# Patient Record
Sex: Male | Born: 1990 | Race: White | Hispanic: No | Marital: Single | Smoking: Current every day smoker
Health system: Southern US, Community
[De-identification: ages and names within clinical notes are randomized; demographics above are authoritative.]

## PROBLEM LIST (undated history)

## (undated) DIAGNOSIS — F329 Major depressive disorder, single episode, unspecified: Secondary | ICD-10-CM

## (undated) DIAGNOSIS — F32A Depression, unspecified: Secondary | ICD-10-CM

## (undated) DIAGNOSIS — K219 Gastro-esophageal reflux disease without esophagitis: Secondary | ICD-10-CM

## (undated) HISTORY — PX: WISDOM TOOTH EXTRACTION: SHX21

---

## 2012-12-08 ENCOUNTER — Encounter (HOSPITAL_COMMUNITY): Payer: Self-pay | Admitting: Emergency Medicine

## 2012-12-08 ENCOUNTER — Emergency Department (HOSPITAL_COMMUNITY)
Admission: EM | Admit: 2012-12-08 | Discharge: 2012-12-08 | Disposition: A | Discharge status: Home / Self Care | Payer: Self-pay | Attending: Emergency Medicine | Admitting: Emergency Medicine

## 2012-12-08 ENCOUNTER — Emergency Department (HOSPITAL_COMMUNITY): Payer: Self-pay

## 2012-12-08 DIAGNOSIS — Z8659 Personal history of other mental and behavioral disorders: Secondary | ICD-10-CM | POA: Insufficient documentation

## 2012-12-08 DIAGNOSIS — R509 Fever, unspecified: Secondary | ICD-10-CM | POA: Insufficient documentation

## 2012-12-08 DIAGNOSIS — K219 Gastro-esophageal reflux disease without esophagitis: Secondary | ICD-10-CM | POA: Insufficient documentation

## 2012-12-08 DIAGNOSIS — Z79899 Other long term (current) drug therapy: Secondary | ICD-10-CM | POA: Insufficient documentation

## 2012-12-08 HISTORY — DX: Gastro-esophageal reflux disease without esophagitis: K21.9

## 2012-12-08 HISTORY — DX: Depression, unspecified: F32.A

## 2012-12-08 HISTORY — DX: Major depressive disorder, single episode, unspecified: F32.9

## 2012-12-08 LAB — BASIC METABOLIC PANEL
BUN: 12 mg/dL (ref 6–23)
CO2: 30 mEq/L (ref 19–32)
Calcium: 9.7 mg/dL (ref 8.4–10.5)
Creatinine, Ser: 0.89 mg/dL (ref 0.50–1.35)
GFR calc Af Amer: >90 mL/min (ref >90)
Glucose, Bld: 77 mg/dL (ref 70–99)

## 2012-12-08 LAB — POCT I-STAT TROPONIN I: Troponin i, poc: 0 ng/mL (ref 0.00–0.08)

## 2012-12-08 LAB — CBC
HCT: 45.3 % (ref 39.0–52.0)
Hemoglobin: 15.7 g/dL (ref 13.0–17.0)
MCH: 29.7 pg (ref 26.0–34.0)
MCV: 85.6 fL (ref 78.0–100.0)
RBC: 5.29 MIL/uL (ref 4.22–5.81)

## 2012-12-08 MED ORDER — OMEPRAZOLE 20 MG PO CPDR
20.0000 mg | DELAYED_RELEASE_CAPSULE | Freq: Every day | ORAL | Status: DC
Start: 2012-12-08 — End: 2015-03-25

## 2012-12-08 MED ORDER — PANTOPRAZOLE SODIUM 40 MG PO TBEC
40.0000 mg | DELAYED_RELEASE_TABLET | Freq: Every day | ORAL | Status: DC
  Administered 2012-12-08: 40 mg via ORAL
  Filled 2012-12-08: qty 1

## 2012-12-08 MED ORDER — GI COCKTAIL ~~LOC~~
30.0000 mL | Freq: Once | ORAL | Status: AC
  Administered 2012-12-08: 30 mL via ORAL
  Filled 2012-12-08: qty 30
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
## 2012-12-08 NOTE — ED Provider Notes (Signed)
CSN: [id number]     Arrival date & time 12/08/12  1901 History   First MD Initiated Contact with Patient 12/08/12 2047     Chief Complaint  Patient presents with  . Chest Pain   (Consider location/radiation/quality/duration/timing/severity/associated sxs/prior Treatment) HPI  This is a 22 year old male who presents with chest pain. Patient reports intermittent chest pain her last several years. He does have a history of GERD and was on Prilosec but has stopped taking that. He associates his chest pain with eating and stress. He reports that it is sharp pain and nonradiating. He had increased pain this evening. He denies any nausea or vomiting. Usually the pain is worse at night. Patient currently reports his pain is 3/10. He denies any pleuritic component to the pain. He denies any shortness of breath, blood clots, leg swelling, recent hospitalization.  Past Medical History  Diagnosis Date  . GERD (gastroesophageal reflux disease)   . Depression    History reviewed. No pertinent past surgical history. History reviewed. No pertinent family history. History  Substance Use Topics  . Smoking status: Never Smoker   . Smokeless tobacco: Never Used  . Alcohol Use: Yes     Comment: occ.    Review of Systems  Constitutional: Positive for fever.  Respiratory: Negative.  Negative for shortness of breath.   Cardiovascular: Positive for chest pain.  Gastrointestinal: Negative.  Negative for nausea, vomiting and abdominal pain.  Genitourinary: Negative.  Negative for dysuria.  Musculoskeletal: Negative for back pain.  Neurological: Negative for headaches.  All other systems reviewed and are negative.    Allergies  Review of patient's allergies indicates no known allergies.  Home Medications   Current Outpatient Rx  Name  Route  Sig  Dispense  Refill  . ibuprofen (ADVIL,MOTRIN) 200 MG tablet   Oral   Take 400 mg by mouth every 6 (six) hours as needed (pain).         Marland Kitchen omeprazole  (PRILOSEC) 20 MG capsule   Oral   Take 1 capsule (20 mg total) by mouth daily.   30 capsule   0    BP 118/63  Pulse 62  Temp(Src) 97.8 F (36.6 C) (Oral)  Resp 16  SpO2 97% Physical Exam  Nursing note and vitals reviewed. Constitutional: He is oriented to person, place, and time. He appears well-developed and well-nourished. No distress.  HENT:  Head: Normocephalic and atraumatic.  Cardiovascular: Normal rate, regular rhythm and normal heart sounds.   No murmur heard. Pulmonary/Chest: Effort normal and breath sounds normal. No respiratory distress.  Abdominal: Soft. Bowel sounds are normal. There is no tenderness.  Musculoskeletal: He exhibits no edema.  Lymphadenopathy:    He has no cervical adenopathy.  Neurological: He is alert and oriented to person, place, and time.  Skin: Skin is warm and dry.  Psychiatric: He has a normal mood and affect.    ED Course  Procedures (including critical care time) Labs Review Labs Reviewed  CBC  BASIC METABOLIC PANEL  PRO B NATRIURETIC PEPTIDE  POCT I-STAT TROPONIN I   Imaging Review Dg Chest 2 View  12/08/2012   CLINICAL DATA:  Chest pain for 2 years.  Ex-smoker.  EXAM: CHEST  2 VIEW  COMPARISON:  None.  FINDINGS: A minimal pectus excavatum deformity. Midline trachea. Normal heart size and mediastinal contours. No pleural effusion or pneumothorax. Clear lungs.  IMPRESSION: Normal chest.   Electronically Signed   By: Jeronimo Greaves M.D.   On: 12/08/2012 19:47  EKG Interpretation    Date/Time:  Monday December 08 2012 19:06:38 EST Ventricular Rate:  79 PR Interval:  160 QRS Duration: 96 QT Interval:  356 QTC Calculation: 408 R Axis:   99 Text Interpretation:  Sinus rhythm Borderline right axis deviation Nonspecific T abnrm, anterolateral leads ST elev, probable normal early repol pattern No old tracing to compare Confirmed by [facility]  MD, TREY ([id number]) on 12/08/2012 7:12:33 PM            MDM   1. GERD (gastroesophageal  reflux disease)    Symptoms most consistent with GERD. Screening chest x-ray is negative for pneumonia or pneumothorax and troponin is negative. EKG is reassuring. Patient was given GI cocktail and PPI with improvement of symptoms. He was encouraged to restart a PPI as an outpatient.  After history, exam, and medical workup I feel the patient has been appropriately medically screened and is safe for discharge home. Pertinent diagnoses were discussed with the patient. Patient was given return precautions.     Shon Baton, MD 12/09/12 [phone]

## 2012-12-08 NOTE — ED Notes (Signed)
Pt reports he was dx with GERD but was never able to fill his Rx.

## 2013-04-25 ENCOUNTER — Encounter (HOSPITAL_COMMUNITY): Payer: Self-pay | Admitting: Emergency Medicine

## 2013-04-25 ENCOUNTER — Emergency Department (HOSPITAL_COMMUNITY)
Admission: EM | Admit: 2013-04-25 | Discharge: 2013-04-25 | Disposition: A | Discharge status: Home / Self Care | Payer: Self-pay | Attending: Emergency Medicine | Admitting: Emergency Medicine

## 2013-04-25 DIAGNOSIS — T300 Burn of unspecified body region, unspecified degree: Secondary | ICD-10-CM

## 2013-04-25 DIAGNOSIS — K219 Gastro-esophageal reflux disease without esophagitis: Secondary | ICD-10-CM | POA: Insufficient documentation

## 2013-04-25 DIAGNOSIS — X131XXA Other contact with steam and other hot vapors, initial encounter: Secondary | ICD-10-CM

## 2013-04-25 DIAGNOSIS — X12XXXA Contact with other hot fluids, initial encounter: Secondary | ICD-10-CM | POA: Insufficient documentation

## 2013-04-25 DIAGNOSIS — T23229A Burn of second degree of unspecified single finger (nail) except thumb, initial encounter: Secondary | ICD-10-CM | POA: Insufficient documentation

## 2013-04-25 DIAGNOSIS — Z79899 Other long term (current) drug therapy: Secondary | ICD-10-CM | POA: Insufficient documentation

## 2013-04-25 DIAGNOSIS — Y93G3 Activity, cooking and baking: Secondary | ICD-10-CM | POA: Insufficient documentation

## 2013-04-25 DIAGNOSIS — T22239A Burn of second degree of unspecified upper arm, initial encounter: Secondary | ICD-10-CM | POA: Insufficient documentation

## 2013-04-25 DIAGNOSIS — Z8659 Personal history of other mental and behavioral disorders: Secondary | ICD-10-CM | POA: Insufficient documentation

## 2013-04-25 DIAGNOSIS — Y9289 Other specified places as the place of occurrence of the external cause: Secondary | ICD-10-CM | POA: Insufficient documentation

## 2013-04-25 MED ORDER — SILVER SULFADIAZINE 1 % EX CREA: 1.0000 "application " | TOPICAL_CREAM | Freq: Every day | CUTANEOUS | Status: DC

## 2013-04-25 MED ORDER — MORPHINE SULFATE 4 MG/ML IJ SOLN
4.0000 mg | Freq: Once | INTRAMUSCULAR | Status: AC
  Administered 2013-04-25: 4 mg via INTRAVENOUS
  Filled 2013-04-25: qty 1

## 2013-04-25 MED ORDER — SILVER SULFADIAZINE 1 % EX CREA
TOPICAL_CREAM | Freq: Once | CUTANEOUS | Status: AC
  Administered 2013-04-25: 1 via TOPICAL
  Filled 2013-04-25: qty 85

## 2013-04-25 MED ORDER — HYDROCODONE-ACETAMINOPHEN 5-325 MG PO TABS: 1.0000 | ORAL_TABLET | Freq: Four times a day (QID) | ORAL | Status: DC | PRN
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
## 2013-04-25 NOTE — Discharge Instructions (Signed)

## 2013-04-25 NOTE — ED Notes (Signed)
Pt states he is SOB. Lungs are clear bilaterally and O2 sats 99% on room air.
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
## 2014-03-23 ENCOUNTER — Encounter (HOSPITAL_COMMUNITY): Payer: Self-pay | Admitting: *Deleted

## 2014-03-23 ENCOUNTER — Emergency Department (HOSPITAL_COMMUNITY)
Admission: EM | Admit: 2014-03-23 | Discharge: 2014-03-23 | Disposition: A | Discharge status: Home / Self Care | Payer: Self-pay | Attending: Emergency Medicine | Admitting: Emergency Medicine

## 2014-03-23 DIAGNOSIS — Z79899 Other long term (current) drug therapy: Secondary | ICD-10-CM | POA: Insufficient documentation

## 2014-03-23 DIAGNOSIS — Z792 Long term (current) use of antibiotics: Secondary | ICD-10-CM | POA: Insufficient documentation

## 2014-03-23 DIAGNOSIS — K219 Gastro-esophageal reflux disease without esophagitis: Secondary | ICD-10-CM | POA: Insufficient documentation

## 2014-03-23 DIAGNOSIS — J069 Acute upper respiratory infection, unspecified: Secondary | ICD-10-CM | POA: Insufficient documentation

## 2014-03-23 DIAGNOSIS — Z8659 Personal history of other mental and behavioral disorders: Secondary | ICD-10-CM | POA: Insufficient documentation

## 2014-03-23 MED ORDER — IBUPROFEN 800 MG PO TABS: 800.0000 mg | ORAL_TABLET | Freq: Three times a day (TID) | ORAL | Status: DC | PRN

## 2014-03-23 MED ORDER — HYDROCODONE-ACETAMINOPHEN 7.5-325 MG/15ML PO SOLN: 10.0000 mL | Freq: Four times a day (QID) | ORAL | Status: DC | PRN

## 2014-03-23 NOTE — Discharge Instructions (Signed)
Read the information below.  Use the prescribed medication as directed.  Please discuss all new medications with your pharmacist.  You may return to the Emergency Department at any time for worsening condition or any new symptoms that concern you.   If you develop high fevers, difficulty swallowing or breathing, or you are unable to tolerate fluids by mouth, return to the ER immediately for a recheck.      Upper Respiratory Infection, Adult An upper respiratory infection (URI) is also sometimes known as the common cold. The upper respiratory tract includes the nose, sinuses, throat, trachea, and bronchi. Bronchi are the airways leading to the lungs. Most people improve within 1 week, but symptoms can last up to 2 weeks. A residual cough may last even longer.  CAUSES Many different viruses can infect the tissues lining the upper respiratory tract. The tissues become irritated and inflamed and often become very moist. Mucus production is also common. A cold is contagious. You can easily spread the virus to others by oral contact. This includes kissing, sharing a glass, coughing, or sneezing. Touching your mouth or nose and then touching a surface, which is then touched by another person, can also spread the virus. SYMPTOMS  Symptoms typically develop 1 to 3 days after you come in contact with a cold virus. Symptoms vary from person to person. They may include:  Runny nose.  Sneezing.  Nasal congestion.  Sinus irritation.  Sore throat.  Loss of voice (laryngitis).  Cough.  Fatigue.  Muscle aches.  Loss of appetite.  Headache.  Low-grade fever. DIAGNOSIS  You might diagnose your own cold based on familiar symptoms, since most people get a cold 2 to 3 times a year. Your caregiver can confirm this based on your exam. Most importantly, your caregiver can check that your symptoms are not due to another disease such as strep throat, sinusitis, pneumonia, asthma, or epiglottitis. Blood tests,  throat tests, and X-rays are not necessary to diagnose a common cold, but they may sometimes be helpful in excluding other more serious diseases. Your caregiver will decide if any further tests are required. RISKS AND COMPLICATIONS  You may be at risk for a more severe case of the common cold if you smoke cigarettes, have chronic heart disease (such as heart failure) or lung disease (such as asthma), or if you have a weakened immune system. The very young and very old are also at risk for more serious infections. Bacterial sinusitis, middle ear infections, and bacterial pneumonia can complicate the common cold. The common cold can worsen asthma and chronic obstructive pulmonary disease (COPD). Sometimes, these complications can require emergency medical care and may be life-threatening. PREVENTION  The best way to protect against getting a cold is to practice good hygiene. Avoid oral or hand contact with people with cold symptoms. Wash your hands often if contact occurs. There is no clear evidence that vitamin C, vitamin E, echinacea, or exercise reduces the chance of developing a cold. However, it is always recommended to get plenty of rest and practice good nutrition. TREATMENT  Treatment is directed at relieving symptoms. There is no cure. Antibiotics are not effective, because the infection is caused by a virus, not by bacteria. Treatment may include:  Increased fluid intake. Sports drinks offer valuable electrolytes, sugars, and fluids.  Breathing heated mist or steam (vaporizer or shower).  Eating chicken soup or other clear broths, and maintaining good nutrition.  Getting plenty of rest.  Using gargles or lozenges for  comfort.  Controlling fevers with ibuprofen or acetaminophen as directed by your caregiver.  Increasing usage of your inhaler if you have asthma. Zinc gel and zinc lozenges, taken in the first 24 hours of the common cold, can shorten the duration and lessen the severity of  symptoms. Pain medicines may help with fever, muscle aches, and throat pain. A variety of non-prescription medicines are available to treat congestion and runny nose. Your caregiver can make recommendations and may suggest nasal or lung inhalers for other symptoms.  HOME CARE INSTRUCTIONS   Only take over-the-counter or prescription medicines for pain, discomfort, or fever as directed by your caregiver.  Use a warm mist humidifier or inhale steam from a shower to increase air moisture. This may keep secretions moist and make it easier to breathe.  Drink enough water and fluids to keep your urine clear or pale yellow.  Rest as needed.  Return to work when your temperature has returned to normal or as your caregiver advises. You may need to stay home longer to avoid infecting others. You can also use a face mask and careful hand washing to prevent spread of the virus. SEEK MEDICAL CARE IF:   After the first few days, you feel you are getting worse rather than better.  You need your caregiver's advice about medicines to control symptoms.  You develop chills, worsening shortness of breath, or brown or red sputum. These may be signs of pneumonia.  You develop yellow or brown nasal discharge or pain in the face, especially when you bend forward. These may be signs of sinusitis.  You develop a fever, swollen neck glands, pain with swallowing, or white areas in the back of your throat. These may be signs of strep throat. SEEK IMMEDIATE MEDICAL CARE IF:   You have a fever.  You develop severe or persistent headache, ear pain, sinus pain, or chest pain.  You develop wheezing, a prolonged cough, cough up blood, or have a change in your usual mucus (if you have chronic lung disease).  You develop sore muscles or a stiff neck. Document Released: 06/20/2000 Document Revised: 03/19/2011 Document Reviewed: 04/01/2013 [facility] Patient Information 2015 [place], [place]. This information is not intended  to replace advice given to you by your health care provider. Make sure you discuss any questions you have with your health care provider.

## 2014-03-23 NOTE — ED Provider Notes (Signed)
CSN: [id number]     Arrival date & time 03/23/14  1323 History  This chart was scribed for Trixie Dredge, PA-C with Shon Baton, MD by Tonye Royalty, ED Scribe. This patient was seen in room TR05C/TR05C and the patient's care was started at 2:17 PM.    Chief Complaint  Patient presents with  . Sore Throat   HPI  HPI Comments: Terrence Shaw is a 24 y.o. male who presents to the Emergency Department complaining of sore throat with onset 2 days ago. He reports associated cough that produces green phlegm. He states then when he swallows, it feels like choking. He states he has not used any medication for it. He denies sick contacts.  He denies rhinorrhea congestion, ear pain, fever, chills, body aches, SOB, or chest pain.  Past Medical History  Diagnosis Date  . GERD (gastroesophageal reflux disease)   . Depression    History reviewed. No pertinent past surgical history. History reviewed. No pertinent family history. History  Substance Use Topics  . Smoking status: Never Smoker   . Smokeless tobacco: Never Used  . Alcohol Use: Yes     Comment: occ.    Review of Systems  Constitutional: Negative for fever and chills.  HENT: Positive for sore throat. Negative for congestion, ear pain and rhinorrhea.   Respiratory: Positive for cough. Negative for shortness of breath.   Cardiovascular: Negative for chest pain.  Musculoskeletal: Negative for myalgias.      Allergies  Review of patient's allergies indicates no known allergies.  Home Medications   Prior to Admission medications   Medication Sig Start Date End Date Taking? Authorizing Provider  albuterol (PROVENTIL HFA;VENTOLIN HFA) 108 (90 BASE) MCG/ACT inhaler Inhale 2 puffs into the lungs every 6 (six) hours as needed for wheezing or shortness of breath.    Historical Provider, MD  HYDROcodone-acetaminophen (NORCO/VICODIN) 5-325 MG per tablet Take 1 tablet by mouth every 6 (six) hours as needed. 04/25/13   Loma Boston, MD   ibuprofen (ADVIL,MOTRIN) 200 MG tablet Take 400 mg by mouth every 6 (six) hours as needed (pain).    Historical Provider, MD  omeprazole (PRILOSEC) 20 MG capsule Take 1 capsule (20 mg total) by mouth daily. 12/08/12   Shon Baton, MD  silver sulfADIAZINE (SILVADENE) 1 % cream Apply 1 application topically daily. 04/25/13   Loma Boston, MD   BP 134/74 mmHg  Pulse 104  Temp(Src) 98.5 F (36.9 C) (Oral)  Resp 16  SpO2 100% Physical Exam  Constitutional: He appears well-developed and well-nourished. No distress.  HENT:  Head: Normocephalic and atraumatic.  Mouth/Throat: Oropharynx is clear and moist. No oropharyngeal exudate.  Erythema and mild edema, no exudates  Eyes: Conjunctivae and EOM are normal. Right eye exhibits no discharge. Left eye exhibits no discharge.  Neck: Normal range of motion. Neck supple.  Mild tender anterior cervical lymphadenopathy  Cardiovascular: Normal rate and regular rhythm.   Pulmonary/Chest: Effort normal and breath sounds normal. No stridor. No respiratory distress. He has no wheezes. He has no rales.  Lymphadenopathy:    He has cervical adenopathy.  Neurological: He is alert.  Skin: He is not diaphoretic.  Nursing note and vitals reviewed.   ED Course  Procedures (including critical care time)  DIAGNOSTIC STUDIES: Oxygen Saturation is 100% on room air, normal by my interpretation.    COORDINATION OF CARE: 2:22 PM Discussed treatment plan with patient at beside, the patient agrees with the plan and has no further questions at this  time.   Labs Review Labs Reviewed - No data to display  Imaging Review No results found.   EKG Interpretation None      MDM   Final diagnoses:  URI (upper respiratory infection)    Afebrile, nontoxic patient with constellation of symptoms suggestive of viral syndrome.  No concerning findings on exam.  Discharged home with supportive care, PCP follow up.  Discussed result, findings, treatment, and  follow up  with patient.  Pt given return precautions.  Pt verbalizes understanding and agrees with plan.      I personally performed the services described in this documentation, which was scribed in my presence. The recorded information has been reviewed and is accurate.   Trixie Dredgemily Tyri Elmore, PA-C 03/23/14 1522  Shon Batonourtney F Horton, MD 03/26/14 [phone]

## 2014-03-23 NOTE — ED Notes (Signed)
Pt arrives from home and reports having a sore throat with difficulty swallowing since Sunday. Pt reports he has also been coughing up phlegm. Pt states he is been unable to eat rt painful swallowing.

## 2015-03-19 ENCOUNTER — Encounter (HOSPITAL_COMMUNITY): Payer: Self-pay | Admitting: Emergency Medicine

## 2015-03-19 ENCOUNTER — Emergency Department (HOSPITAL_COMMUNITY)
Admission: EM | Admit: 2015-03-19 | Discharge: 2015-03-19 | Disposition: A | Discharge status: Home / Self Care | Payer: Self-pay | Attending: Emergency Medicine | Admitting: Emergency Medicine

## 2015-03-19 DIAGNOSIS — Z8659 Personal history of other mental and behavioral disorders: Secondary | ICD-10-CM | POA: Insufficient documentation

## 2015-03-19 DIAGNOSIS — Z8719 Personal history of other diseases of the digestive system: Secondary | ICD-10-CM | POA: Insufficient documentation

## 2015-03-19 DIAGNOSIS — R1013 Epigastric pain: Secondary | ICD-10-CM | POA: Insufficient documentation

## 2015-03-19 DIAGNOSIS — R110 Nausea: Secondary | ICD-10-CM | POA: Insufficient documentation

## 2015-03-19 LAB — URINALYSIS, ROUTINE W REFLEX MICROSCOPIC
Bilirubin Urine: NEGATIVE
Glucose, UA: NEGATIVE mg/dL
Hgb urine dipstick: NEGATIVE
KETONES UR: NEGATIVE mg/dL
LEUKOCYTES UA: NEGATIVE
NITRITE: NEGATIVE
Protein, ur: NEGATIVE mg/dL
Specific Gravity, Urine: 1.02 (ref 1.005–1.030)
pH: 6.5 (ref 5.0–8.0)

## 2015-03-19 LAB — CBC
HCT: 48.3 % (ref 39.0–52.0)
Hemoglobin: 16.9 g/dL (ref 13.0–17.0)
MCH: 29.8 pg (ref 26.0–34.0)
MCHC: 35 g/dL (ref 30.0–36.0)
MCV: 85.2 fL (ref 78.0–100.0)
PLATELETS: 295 10*3/uL (ref 150–400)
RBC: 5.67 MIL/uL (ref 4.22–5.81)
RDW: 11.9 % (ref 11.5–15.5)
WBC: 8 10*3/uL (ref 4.0–10.5)

## 2015-03-19 LAB — COMPREHENSIVE METABOLIC PANEL
ALT: 15 U/L — ABNORMAL LOW (ref 17–63)
AST: 18 U/L (ref 15–41)
Albumin: 3.8 g/dL (ref 3.5–5.0)
Alkaline Phosphatase: 56 U/L (ref 38–126)
Anion gap: 11 (ref 5–15)
BILIRUBIN TOTAL: 0.8 mg/dL (ref 0.3–1.2)
BUN: 11 mg/dL (ref 6–20)
CO2: 25 mmol/L (ref 22–32)
CREATININE: 1.07 mg/dL (ref 0.61–1.24)
Calcium: 9.2 mg/dL (ref 8.9–10.3)
Chloride: 104 mmol/L (ref 101–111)
GFR calc Af Amer: >60 mL/min (ref >60)
Glucose, Bld: 77 mg/dL (ref 65–99)
POTASSIUM: 4.3 mmol/L (ref 3.5–5.1)
Sodium: 140 mmol/L (ref 135–145)
TOTAL PROTEIN: 5.4 g/dL — AB (ref 6.5–8.1)

## 2015-03-19 LAB — LIPASE, BLOOD: Lipase: 24 U/L (ref 11–51)

## 2015-03-19 MED ORDER — OXYCODONE-ACETAMINOPHEN 5-325 MG PO TABS
1.0000 | ORAL_TABLET | Freq: Once | ORAL | Status: AC
  Administered 2015-03-19: 1 via ORAL
  Filled 2015-03-19: qty 1

## 2015-03-19 MED ORDER — ONDANSETRON 4 MG PO TBDP
4.0000 mg | ORAL_TABLET | Freq: Once | ORAL | Status: AC
  Administered 2015-03-19: 4 mg via ORAL
  Filled 2015-03-19: qty 1

## 2015-03-19 MED ORDER — SUCRALFATE 1 G PO TABS: 1.0000 g | ORAL_TABLET | Freq: Three times a day (TID) | ORAL | Status: DC

## 2015-03-19 MED ORDER — ONDANSETRON HCL 4 MG PO TABS: 4.0000 mg | ORAL_TABLET | Freq: Four times a day (QID) | ORAL | Status: DC

## 2015-03-19 MED ORDER — GI COCKTAIL ~~LOC~~
30.0000 mL | Freq: Once | ORAL | Status: AC
  Administered 2015-03-19: 30 mL via ORAL
  Filled 2015-03-19: qty 30

## 2015-03-19 NOTE — ED Notes (Signed)
Pt stable, ambulatory, states understanding of discharge instructions 

## 2015-03-19 NOTE — ED Notes (Signed)
Pt c/o abdominal pain x 3 months or longer. Pt reports N/V since January.

## 2015-03-19 NOTE — Discharge Instructions (Signed)
Return to the ED with any concerns including vomiting and not able to keep down liquids, worsening abdominal pain especially if it localizes to the right lower abdomen, decreased level of alertness/lethargy, or any other alarming symptoms °

## 2015-03-19 NOTE — ED Provider Notes (Signed)
CSN: [id number]     Arrival date & time 03/19/15  1737 History   First MD Initiated Contact with Patient 03/19/15 2156     Chief Complaint  Patient presents with  . Abdominal Pain     (Consider location/radiation/quality/duration/timing/severity/associated sxs/prior Treatment) HPI  Pt presenting with c/o epigastric pain.  Pt states he has been having similar pains for the past 3 months.  At times he has nausea and vomiting.  No change in stools.  No fever/chills.  Pt has been having a decreased appetite but continues to drink liquids well.  He states he used to take prilosec but it "doesn't work anymore".  He states he has had no further treatment.  When he has vomiting it is nonbloody and nonbilious.  No vomiting today but does feel nauseated.  There are no other associated systemic symptoms, there are no other alleviating or modifying factors.   Past Medical History  Diagnosis Date  . GERD (gastroesophageal reflux disease)   . Depression    History reviewed. No pertinent past surgical history. No family history on file. Social History  Substance Use Topics  . Smoking status: Never Smoker   . Smokeless tobacco: Never Used  . Alcohol Use: Yes     Comment: occ.    Review of Systems  ROS reviewed and all otherwise negative except for mentioned in HPI    Allergies  Review of patient's allergies indicates no known allergies.  Home Medications   Prior to Admission medications   Medication Sig Start Date End Date Taking? Authorizing Provider  HYDROcodone-acetaminophen (HYCET) 7.5-325 mg/15 ml solution Take 10 mLs by mouth 4 (four) times daily as needed for moderate pain or severe pain. Patient not taking: Reported on 03/19/2015 03/23/14   Trixie Dredge, PA-C  ibuprofen (ADVIL,MOTRIN) 800 MG tablet Take 1 tablet (800 mg total) by mouth every 8 (eight) hours as needed for mild pain or moderate pain. Patient not taking: Reported on 03/19/2015 03/23/14   Trixie Dredge, PA-C  omeprazole  (PRILOSEC) 20 MG capsule Take 1 capsule (20 mg total) by mouth daily. Patient not taking: Reported on 03/19/2015 12/08/12   Shon Baton, MD  ondansetron (ZOFRAN) 4 MG tablet Take 1 tablet (4 mg total) by mouth every 6 (six) hours. 03/19/15   Jerelyn Scott, MD  silver sulfADIAZINE (SILVADENE) 1 % cream Apply 1 application topically daily. Patient not taking: Reported on 03/19/2015 04/25/13   Loma Boston, MD  sucralfate (CARAFATE) 1 g tablet Take 1 tablet (1 g total) by mouth 4 (four) times daily -  with meals and at bedtime. 03/19/15   Jerelyn Scott, MD   BP 117/73 mmHg  Pulse 52  Temp(Src) 98.6 F (37 C) (Oral)  Resp 18  Ht  (1.803 m)  Wt 79.124 kg  BMI 24.34 kg/m2  SpO2 94%  Vitals reviewed Physical Exam  Physical Examination: General appearance - alert, well appearing, and in no distress Mental status - alert, oriented to person, place, and time Eyes - no conjunctival injection, no scleral icterus Chest - clear to auscultation, no wheezes, rales or rhonchi, symmetric air entry Heart - normal rate, regular rhythm, normal S1, S2, no murmurs, rubs, clicks or gallops Abdomen - soft, mild epigastric tenderness, no gaurding or rebound, nondistended, no masses or organomegaly Neurological - alert, oriented, normal speech Extremities - peripheral pulses normal, no pedal edema, no clubbing or cyanosis Skin - normal coloration and turgor, no rashes  ED Course  Procedures (including critical care time) Labs  Review Labs Reviewed  COMPREHENSIVE METABOLIC PANEL - Abnormal; Notable for the following:    Total Protein 5.4 (*)    ALT 15 (*)    All other components within normal limits  LIPASE, BLOOD  CBC  URINALYSIS, ROUTINE W REFLEX MICROSCOPIC (NOT [facility])    Imaging Review No results found. I have personally reviewed and evaluated these images and lab results as part of my medical decision-making.   EKG Interpretation None      MDM   Final diagnoses:  Epigastric  pain    Pt presenting with c/o epiastric pain with nasuea and vomiting which has been ongoing for the past 3 months.  Suspect GERd, gastritis, may have gallstones- but no RUQ tenderness and no evidence of lab abnormality to suggest obstructing stone or cholecystitis.   Patient is overall nontoxic and well hydrated in appearance.  Abdominal exam is benign.  Pt given nausea meds and gi cocktail for symptoms.  D/w patient the need for outpatient followup.  Will give carafate in addition to his prilosec.  May need an ultrasound as an outpatient- this was d/w patient in detail.   Discharged with strict return precautions.  Pt agreeable with plan.     Jerelyn ScottMartha Linker, MD 03/19/15 [phone]

## 2015-03-24 ENCOUNTER — Encounter (HOSPITAL_COMMUNITY): Payer: Self-pay | Admitting: Emergency Medicine

## 2015-03-24 DIAGNOSIS — Z8659 Personal history of other mental and behavioral disorders: Secondary | ICD-10-CM | POA: Insufficient documentation

## 2015-03-24 DIAGNOSIS — R1013 Epigastric pain: Secondary | ICD-10-CM | POA: Insufficient documentation

## 2015-03-24 DIAGNOSIS — M545 Low back pain: Secondary | ICD-10-CM | POA: Insufficient documentation

## 2015-03-24 DIAGNOSIS — R1032 Left lower quadrant pain: Secondary | ICD-10-CM | POA: Insufficient documentation

## 2015-03-24 DIAGNOSIS — R1011 Right upper quadrant pain: Secondary | ICD-10-CM | POA: Insufficient documentation

## 2015-03-24 DIAGNOSIS — R1012 Left upper quadrant pain: Secondary | ICD-10-CM | POA: Insufficient documentation

## 2015-03-24 DIAGNOSIS — Z8719 Personal history of other diseases of the digestive system: Secondary | ICD-10-CM | POA: Insufficient documentation

## 2015-03-24 LAB — COMPREHENSIVE METABOLIC PANEL
ALK PHOS: 54 U/L (ref 38–126)
ALT: 14 U/L — AB (ref 17–63)
ANION GAP: 10 (ref 5–15)
AST: 19 U/L (ref 15–41)
Albumin: 3.7 g/dL (ref 3.5–5.0)
BILIRUBIN TOTAL: 0.4 mg/dL (ref 0.3–1.2)
BUN: 11 mg/dL (ref 6–20)
CALCIUM: 8.8 mg/dL — AB (ref 8.9–10.3)
CO2: 26 mmol/L (ref 22–32)
CREATININE: 1.14 mg/dL (ref 0.61–1.24)
Chloride: 105 mmol/L (ref 101–111)
GFR calc non Af Amer: >60 mL/min (ref >60)
GLUCOSE: 76 mg/dL (ref 65–99)
Potassium: 3.9 mmol/L (ref 3.5–5.1)
Sodium: 141 mmol/L (ref 135–145)
TOTAL PROTEIN: 5.4 g/dL — AB (ref 6.5–8.1)

## 2015-03-24 LAB — URINALYSIS, ROUTINE W REFLEX MICROSCOPIC
BILIRUBIN URINE: NEGATIVE
Glucose, UA: NEGATIVE mg/dL
HGB URINE DIPSTICK: NEGATIVE
Ketones, ur: NEGATIVE mg/dL
Leukocytes, UA: NEGATIVE
NITRITE: NEGATIVE
PROTEIN: 30 mg/dL — AB
SPECIFIC GRAVITY, URINE: 1.024 (ref 1.005–1.030)
pH: 8 (ref 5.0–8.0)

## 2015-03-24 LAB — CBC
HCT: 49 % (ref 39.0–52.0)
HEMOGLOBIN: 16.9 g/dL (ref 13.0–17.0)
MCH: 29.5 pg (ref 26.0–34.0)
MCHC: 34.5 g/dL (ref 30.0–36.0)
MCV: 85.5 fL (ref 78.0–100.0)
PLATELETS: 322 10*3/uL (ref 150–400)
RBC: 5.73 MIL/uL (ref 4.22–5.81)
RDW: 12 % (ref 11.5–15.5)
WBC: 10.5 10*3/uL (ref 4.0–10.5)

## 2015-03-24 LAB — URINE MICROSCOPIC-ADD ON: RBC / HPF: NONE SEEN RBC/hpf (ref 0–5)

## 2015-03-24 LAB — LIPASE, BLOOD: Lipase: 20 U/L (ref 11–51)

## 2015-03-24 NOTE — ED Notes (Signed)
Pt. reports mid abdominal pain and left lower back pain onset this week , denies emesis or diarrhea , no fever or chills.

## 2015-03-25 ENCOUNTER — Emergency Department (HOSPITAL_COMMUNITY): Payer: Self-pay

## 2015-03-25 ENCOUNTER — Encounter (HOSPITAL_COMMUNITY): Payer: Self-pay | Admitting: Radiology

## 2015-03-25 ENCOUNTER — Emergency Department (HOSPITAL_COMMUNITY)
Admission: EM | Admit: 2015-03-25 | Discharge: 2015-03-25 | Disposition: A | Discharge status: Home / Self Care | Payer: Self-pay | Attending: Emergency Medicine | Admitting: Emergency Medicine

## 2015-03-25 DIAGNOSIS — R1013 Epigastric pain: Secondary | ICD-10-CM

## 2015-03-25 DIAGNOSIS — R1011 Right upper quadrant pain: Secondary | ICD-10-CM

## 2015-03-25 MED ORDER — ONDANSETRON HCL 4 MG/2ML IJ SOLN
4.0000 mg | Freq: Once | INTRAMUSCULAR | Status: AC
  Administered 2015-03-25: 4 mg via INTRAVENOUS
  Filled 2015-03-25: qty 2

## 2015-03-25 MED ORDER — IOPAMIDOL (ISOVUE-300) INJECTION 61%: 100.0000 mL | Freq: Once | INTRAVENOUS | Status: DC | PRN

## 2015-03-25 MED ORDER — ONDANSETRON HCL 4 MG PO TABS: 4.0000 mg | ORAL_TABLET | Freq: Four times a day (QID) | ORAL | Status: DC

## 2015-03-25 MED ORDER — ONDANSETRON HCL 4 MG/2ML IJ SOLN: 4.0000 mg | Freq: Once | INTRAMUSCULAR | Status: DC

## 2015-03-25 MED ORDER — HYDROCODONE-ACETAMINOPHEN 5-325 MG PO TABS: 1.0000 | ORAL_TABLET | ORAL | Status: DC | PRN

## 2015-03-25 MED ORDER — SODIUM CHLORIDE 0.9 % IV BOLUS (SEPSIS)
1000.0000 mL | Freq: Once | INTRAVENOUS | Status: AC
  Administered 2015-03-25: 1000 mL via INTRAVENOUS

## 2015-03-25 MED ORDER — IOHEXOL 300 MG/ML  SOLN
100.0000 mL | Freq: Once | INTRAMUSCULAR | Status: AC | PRN
  Administered 2015-03-25: 100 mL via INTRAVENOUS

## 2015-03-25 MED ORDER — MORPHINE SULFATE (PF) 4 MG/ML IV SOLN
4.0000 mg | INTRAVENOUS | Status: DC | PRN
  Administered 2015-03-25 (×2): 4 mg via INTRAVENOUS
  Filled 2015-03-25 (×2): qty 1

## 2015-03-25 MED ORDER — SODIUM CHLORIDE 0.9 % IV BOLUS (SEPSIS): 1000.0000 mL | Freq: Once | INTRAVENOUS | Status: DC

## 2015-03-25 NOTE — Discharge Instructions (Signed)
Food Choices for Peptic Ulcer Disease °When you have peptic ulcer disease, the foods you eat and your eating habits are very important. Choosing the right foods can help ease the discomfort of peptic ulcer disease. °WHAT GENERAL GUIDELINES DO I NEED TO FOLLOW? °· Choose fruits, vegetables, whole grains, and low-fat meat, fish, and poultry.   °· Keep a food diary to identify foods that cause symptoms. °· Avoid foods that cause irritation or pain. These may be different for different people. °· Eat frequent small meals instead of three large meals each day. The pain may be worse when your stomach is empty.  °· Avoid eating close to bedtime. °WHAT FOODS ARE NOT RECOMMENDED? °The following are some foods and drinks that may worsen your symptoms: °· Black, white, and red pepper. °· Hot sauce. °· Chili peppers. °· Chili powder. °· Chocolate and cocoa.    °· Alcohol. °· Tea, coffee, and cola (regular and decaffeinated). °The items listed above may not be a complete list of foods and beverages to avoid. Contact your dietitian for more information. °  °This information is not intended to replace advice given to you by your health care provider. Make sure you discuss any questions you have with your health care provider. °  °Document Released: 03/19/2011 Document Revised: 12/30/2012 Document Reviewed: 10/29/2012 °Elsevier Interactive Patient Education ©2016 [employer] °Peptic Ulcer °A peptic ulcer is a sore in the lining of your esophagus (esophageal ulcer), stomach (gastric ulcer), or in the first part of your small intestine (duodenal ulcer). The ulcer causes erosion into the deeper tissue. °CAUSES  °Normally, the lining of the stomach and the small intestine protects itself from the acid that digests food. The protective lining can be damaged by: °· An infection caused by a bacterium called Helicobacter pylori (H. pylori). °· Regular use of nonsteroidal anti-inflammatory drugs (NSAIDs), such as ibuprofen or  aspirin. °· Smoking tobacco. °Other risk factors include being older than 50, drinking alcohol excessively, and having a family history of ulcer disease.  °SYMPTOMS  °· Burning pain or gnawing in the area between the chest and the belly button. °· Heartburn. °· Nausea and vomiting. °· Bloating. °The pain can be worse on an empty stomach and at night. If the ulcer results in bleeding, it can cause: °· Black, tarry stools. °· Vomiting of bright red blood. °· Vomiting of coffee-ground-looking materials. °DIAGNOSIS  °A diagnosis is usually made based upon your history and an exam. Other tests and procedures may be performed to find the cause of the ulcer. Finding a cause will help determine the best treatment. Tests and procedures may include: °· Blood tests, stool tests, or breath tests to check for the bacterium H. pylori. °· An upper gastrointestinal (GI) series of the esophagus, stomach, and small intestine. °· An endoscopy to examine the esophagus, stomach, and small intestine. °· A biopsy. °TREATMENT  °Treatment may include: °· Eliminating the cause of the ulcer, such as smoking, NSAIDs, or alcohol. °· Medicines to reduce the amount of acid in your digestive tract. °· Antibiotic medicines if the ulcer is caused by the H. pylori bacterium. °· An upper endoscopy to treat a bleeding ulcer. °· Surgery if the bleeding is severe or if the ulcer created a hole somewhere in the digestive system. °HOME CARE INSTRUCTIONS  °· Avoid tobacco, alcohol, and caffeine. Smoking can increase the acid in the stomach, and continued smoking will impair the healing of ulcers. °· Avoid foods and drinks that seem to cause discomfort or aggravate your ulcer. °·   Only take medicines as directed by your caregiver. Do not substitute over-the-counter medicines for prescription medicines without talking to your caregiver. °· Keep any follow-up appointments and tests as directed. °SEEK MEDICAL CARE IF:  °· Your do not improve within 7 days of  starting treatment. °· You have ongoing indigestion or heartburn. °SEEK IMMEDIATE MEDICAL CARE IF:  °· You have sudden, sharp, or persistent abdominal pain. °· You have bloody or dark black, tarry stools. °· You vomit blood or vomit that looks like coffee grounds. °· You become light-headed, weak, or feel faint. °· You become sweaty or clammy. °MAKE SURE YOU:  °· Understand these instructions. °· Will watch your condition. °· Will get help right away if you are not doing well or get worse. °  °This information is not intended to replace advice given to you by your health care provider. Make sure you discuss any questions you have with your health care provider. °  °Document Released: 12/23/1999 Document Revised: 01/15/2014 Document Reviewed: 07/25/2011 °Elsevier Interactive Patient Education ©2016 [employer] ° °

## 2015-03-25 NOTE — ED Provider Notes (Signed)
CSN: [id number]     Arrival date & time 03/24/15  1931 History   First MD Initiated Contact with Patient 03/25/15 0047     Chief Complaint  Patient presents with  . Abdominal Pain     (Consider location/radiation/quality/duration/timing/severity/associated sxs/prior Treatment) HPI   Patient to the ER with complaints of abdominal pain and left lower back pain for 1 week. He was seen on 3/11 for similar symptoms and it has since worsened significantly. The pain is in his RUQ, epigastric region, LLQ abd and lower back. He comes to the ER today because he says his symptoms are worse and he has been doubling over in pain. He has had vomiting. No diarrhea and severe. His symptoms are worse after he eats. The family member who is present is concerned that it may be his gallbladder. He also has hx of GERD and depression.  PCP: No PCP Per Patient  Marqual Mi is a 25 y.o.  male  ROS: The patient denies diaphoresis, fever, headache, weakness (general or focal), confusion, change of vision,  dysphagia, aphagia, shortness of breath,   diarrhea, lower extremity swelling, rash, neck pain, chest pain   Past Medical History  Diagnosis Date  . GERD (gastroesophageal reflux disease)   . Depression    Past Surgical History  Procedure Laterality Date  . Wisdom tooth extraction     No family history on file. Social History  Substance Use Topics  . Smoking status: Never Smoker   . Smokeless tobacco: Never Used  . Alcohol Use: Yes     Comment: occ.    Review of Systems  Review of Systems All other systems negative except as documented in the HPI. All pertinent positives and negatives as reviewed in the HPI.   Allergies  Review of patient's allergies indicates no known allergies.  Home Medications   Prior to Admission medications   Medication Sig Start Date End Date Taking? Authorizing Provider  HYDROcodone-acetaminophen (NORCO/VICODIN) 5-325 MG tablet Take 1-2 tablets by mouth every 4  (four) hours as needed. 03/25/15   Bingham Millette Neva Seat, PA-C  ondansetron (ZOFRAN) 4 MG tablet Take 1 tablet (4 mg total) by mouth every 6 (six) hours. Patient not taking: Reported on 03/25/2015 03/19/15   Jerelyn Scott, MD  ondansetron (ZOFRAN) 4 MG tablet Take 1 tablet (4 mg total) by mouth every 6 (six) hours. 03/25/15   Phenix Grein Neva Seat, PA-C  sucralfate (CARAFATE) 1 g tablet Take 1 tablet (1 g total) by mouth 4 (four) times daily -  with meals and at bedtime. Patient not taking: Reported on 03/25/2015 03/19/15   Jerelyn Scott, MD   BP 110/70 mmHg  Pulse 58  Temp(Src) 98.3 F (36.8 C) (Oral)  Resp 18  SpO2 94% Physical Exam  Constitutional: He appears well-developed and well-nourished. No distress.  HENT:  Head: Normocephalic and atraumatic.  Right Ear: Tympanic membrane and ear canal normal.  Left Ear: Tympanic membrane and ear canal normal.  Nose: Nose normal.  Mouth/Throat: Uvula is midline, oropharynx is clear and moist and mucous membranes are normal.  Eyes: Pupils are equal, round, and reactive to light.  Neck: Normal range of motion. Neck supple.  Cardiovascular: Normal rate and regular rhythm.   Pulmonary/Chest: Effort normal.  Abdominal: Soft. Bowel sounds are normal. He exhibits no distension. There is tenderness in the right upper quadrant, epigastric area, left upper quadrant and left lower quadrant. There is guarding. There is no rigidity, no rebound, no CVA tenderness and negative Murphy's sign.  No  signs of abdominal distention  Musculoskeletal:  No LE swelling  Neurological: He is alert.  Acting at baseline  Skin: Skin is warm and dry. No rash noted.  Nursing note and vitals reviewed.   ED Course  Procedures (including critical care time) Labs Review Labs Reviewed  COMPREHENSIVE METABOLIC PANEL - Abnormal; Notable for the following:    Calcium 8.8 (*)    Total Protein 5.4 (*)    ALT 14 (*)    All other components within normal limits  URINALYSIS, ROUTINE W REFLEX  MICROSCOPIC (NOT AT [facility]) - Abnormal; Notable for the following:    APPearance TURBID (*)    Protein, ur 30 (*)    All other components within normal limits  URINE MICROSCOPIC-ADD ON - Abnormal; Notable for the following:    Squamous Epithelial / LPF 0-5 (*)    Bacteria, UA FEW (*)    All other components within normal limits  LIPASE, BLOOD  CBC    Imaging Review Ct Abdomen Pelvis W Contrast  03/25/2015  CLINICAL DATA:  Epigastric, right upper quadrant, and left lower quadrant pain. EXAM: CT ABDOMEN AND PELVIS WITH CONTRAST TECHNIQUE: Multidetector CT imaging of the abdomen and pelvis was performed using the standard protocol following bolus administration of intravenous contrast. CONTRAST:  OMNIPAQUE IOHEXOL 300 MG/ML  SOLN COMPARISON:  None. FINDINGS: Lower chest and abdominal wall:  No contributory findings. Hepatobiliary: No focal liver abnormality.No evidence of biliary obstruction or stone. Pancreas: Unremarkable. Spleen: Unremarkable. Adrenals/Urinary Tract: Negative adrenals. No hydronephrosis or stone. Unremarkable bladder. Reproductive:No pathologic findings. Stomach/Bowel:  No obstruction. No appendicitis. Vascular/Lymphatic: No acute vascular abnormality. No mass or adenopathy. Peritoneal: No ascites or pneumoperitoneum. Musculoskeletal: No acute abnormalities. IMPRESSION: Negative.  No explanation for abdominal pain. Electronically Signed   By: Marnee Spring M.D.   On: 03/25/2015 03:08   US Abdomen Limited Ruq  03/25/2015  CLINICAL DATA:  Chronic right upper quadrant abdominal pain. Initial encounter. EXAM: US ABDOMEN LIMITED - RIGHT UPPER QUADRANT COMPARISON:  CT abdomen and pelvis performed earlier today at 2:31 a.m. FINDINGS: Gallbladder: No gallstones or wall thickening visualized. No sonographic Murphy sign noted by sonographer. Common bile duct: Diameter: 2.2 cm, within normal limits in caliber. Liver: No focal lesion identified. Within normal limits in parenchymal  echogenicity. IMPRESSION: Unremarkable ultrasound of the right upper quadrant. Electronically Signed   By: Roanna Raider M.D.   On: 03/25/2015 03:54   I have personally reviewed and evaluated these images and lab results as part of my medical decision-making.   EKG Interpretation None      MDM   Final diagnoses:  RUQ pain  Epigastric pain    CT abd/pelv is negative for any abnormalities. Will order RUQ [place] for further evaluation. The patient has been made aware of CT results and is agreeable to [place]. He is still having pain but it is improving with pain medications.  [place] of RUQ/epigastrum is also normal. Patients pain has improved. Likely etiology is a peptic ulcer. Information given on diet guidelines, he is to continue Carafate, Pepcid and ive added pain and nausea medication. He needs to follow-up with GI.  Patient is nontoxic, nonseptic appearing, in no apparent distress.  Patient's pain and other symptoms adequately managed in emergency department.  Fluid bolus given.  Labs, imaging and vitals reviewed.  Patient does not meet the SIRS or Sepsis criteria.  On repeat exam patient does not have a surgical abdomin and there are no peritoneal signs.  No indication of  appendicitis, bowel obstruction, bowel perforation, cholecystitis, diverticulitis,   Patient discharged home with symptomatic treatment and given strict instructions for follow-up with their primary care physician.  I have also discussed reasons to return immediately to the ER.  Patient expresses understanding and agrees with plan.   Medications  morphine 4 MG/ML injection 4 mg (4 mg Intravenous Given 03/25/15 0328)  sodium chloride 0.9 % bolus 1,000 mL (1,000 mLs Intravenous New Bag/Given 03/25/15 0156)  ondansetron (ZOFRAN) injection 4 mg (4 mg Intravenous Given 03/25/15 0156)  iohexol (OMNIPAQUE) 300 MG/ML solution 100 mL (100 mLs Intravenous Contrast Given 03/25/15 0224)     I feel the patient has had an appropriate workup for  their chief complaint at this time and likelihood of emergent condition existing is low. Discussed s/sx that warrant return to the ED.  Filed Vitals:   03/25/15 0246 03/25/15 0300  BP:  110/70  Pulse: 56 58  Temp:    Resp:        Marlon Peliffany Clorine Swing, PA-C 03/25/15 0409  Laurence Spatesachel Morgan Little, MD 03/29/15 1501

## 2015-03-25 NOTE — ED Notes (Signed)
Patient transported to CT 

## 2015-04-29 ENCOUNTER — Emergency Department (HOSPITAL_COMMUNITY)
Admission: EM | Admit: 2015-04-29 | Discharge: 2015-04-29 | Disposition: A | Discharge status: Home / Self Care | Payer: Self-pay | Attending: Emergency Medicine | Admitting: Emergency Medicine

## 2015-04-29 ENCOUNTER — Emergency Department (HOSPITAL_COMMUNITY): Payer: Self-pay

## 2015-04-29 ENCOUNTER — Encounter (HOSPITAL_COMMUNITY): Payer: Self-pay | Admitting: Emergency Medicine

## 2015-04-29 DIAGNOSIS — R0602 Shortness of breath: Secondary | ICD-10-CM | POA: Insufficient documentation

## 2015-04-29 DIAGNOSIS — Z8659 Personal history of other mental and behavioral disorders: Secondary | ICD-10-CM | POA: Insufficient documentation

## 2015-04-29 DIAGNOSIS — R059 Cough, unspecified: Secondary | ICD-10-CM

## 2015-04-29 DIAGNOSIS — B349 Viral infection, unspecified: Secondary | ICD-10-CM | POA: Insufficient documentation

## 2015-04-29 DIAGNOSIS — R05 Cough: Secondary | ICD-10-CM

## 2015-04-29 DIAGNOSIS — Z8719 Personal history of other diseases of the digestive system: Secondary | ICD-10-CM | POA: Insufficient documentation

## 2015-04-29 MED ORDER — ALBUTEROL SULFATE (2.5 MG/3ML) 0.083% IN NEBU
2.5000 mg | INHALATION_SOLUTION | Freq: Once | RESPIRATORY_TRACT | Status: AC
  Administered 2015-04-29: 2.5 mg via RESPIRATORY_TRACT
  Filled 2015-04-29: qty 3

## 2015-04-29 MED ORDER — BENZONATATE 100 MG PO CAPS: 100.0000 mg | ORAL_CAPSULE | Freq: Two times a day (BID) | ORAL | Status: DC | PRN

## 2015-04-29 NOTE — ED Notes (Signed)
Pt. reports dry cough with SOB and chest congestion onset today , denies fever or chills.

## 2015-04-29 NOTE — ED Notes (Signed)
Pt ambulatory and independent at discharge.  

## 2015-04-29 NOTE — Discharge Instructions (Signed)
Please read and follow all provided instructions.  Your diagnoses today include:  1. Cough   2. Viral syndrome    Tests performed today include:  Vital signs. See below for your results today.   Medications prescribed:   Take as prescribed   Home care instructions:  Follow any educational materials contained in this packet.  Follow-up instructions: Please follow-up with your primary care provider in the next week for further evaluation of symptoms and treatment   Return instructions:   Please return to the Emergency Department if you do not get better, if you get worse, or new symptoms OR  - Fever (temperature greater than 101.90F)  - Bleeding that does not stop with holding pressure to the area    -Severe pain (please note that you may be more sore the day after your accident)  - Chest Pain  - Difficulty breathing  - Severe nausea or vomiting  - Inability to tolerate food and liquids  - Passing out  - Skin becoming red around your wounds  - Change in mental status (confusion or lethargy)  - New numbness or weakness     Please return if you have any other emergent concerns.  Additional Information:  Your vital signs today were: BP 128/68 mmHg   Pulse 75   Temp(Src) 97.9 F (36.6 C) (Oral)   Resp 18   Ht 5\' 11"  (1.803 m)   Wt 79.861 kg   BMI 24.57 kg/m2   SpO2 96% If your blood pressure (BP) was elevated above 135/85 this visit, please have this repeated by your doctor within one month. ---------------

## 2015-04-29 NOTE — ED Provider Notes (Signed)
CSN: [id number]     Arrival date & time 04/29/15  2109 History  By signing my name below, I, Linus Galas, attest that this documentation has been prepared under the direction and in the presence of Audry Pili, PA-C. Electronically Signed: Linus Galas, ED Scribe. 04/29/2015. 9:51 PM.   Chief Complaint  Patient presents with  . Cough   The history is provided by the patient. No language interpreter was used.   HPI Comments: Terrence Shaw is a 25 y.o. male who presents to the Emergency Department complaining of dry cough that began today. Pt also reports SOB. Pt denies any fevers, chills, congestion, CP, N/V/D, HA, visual disturbances, or any other symptoms at this time. No pain. Pt denies sick contacts. Pt smokes 2 packs a day.  Pt states he was burning a lot of scrap at work today when symptoms began.  Pt does not have a PCP  Past Medical History  Diagnosis Date  . GERD (gastroesophageal reflux disease)   . Depression    Past Surgical History  Procedure Laterality Date  . Wisdom tooth extraction     No family history on file. Social History  Substance Use Topics  . Smoking status: Never Smoker   . Smokeless tobacco: Never Used  . Alcohol Use: Yes     Comment: occ.    Review of Systems A complete 10 system review of systems was obtained and all systems are negative except as noted in the HPI and PMH.   Allergies  Review of patient's allergies indicates no known allergies.  Home Medications   Prior to Admission medications   Medication Sig Start Date End Date Taking? Authorizing Provider  HYDROcodone-acetaminophen (NORCO/VICODIN) 5-325 MG tablet Take 1-2 tablets by mouth every 4 (four) hours as needed. 03/25/15   Tiffany Neva Seat, PA-C  ondansetron (ZOFRAN) 4 MG tablet Take 1 tablet (4 mg total) by mouth every 6 (six) hours. Patient not taking: Reported on 03/25/2015 03/19/15   Jerelyn Scott, MD  ondansetron (ZOFRAN) 4 MG tablet Take 1 tablet (4 mg total) by mouth every 6  (six) hours. 03/25/15   Tiffany Neva Seat, PA-C  sucralfate (CARAFATE) 1 g tablet Take 1 tablet (1 g total) by mouth 4 (four) times daily -  with meals and at bedtime. Patient not taking: Reported on 03/25/2015 03/19/15   Jerelyn Scott, MD   BP 128/68 mmHg  Pulse 75  Temp(Src) 97.9 F (36.6 C) (Oral)  Resp 18  Ht  (1.803 m)  Wt 176 lb 1 oz (79.861 kg)  BMI 24.57 kg/m2  SpO2 96%   Physical Exam  Constitutional: He is oriented to person, place, and time. He appears well-developed and well-nourished.  HENT:  Head: Normocephalic and atraumatic.  Eyes: EOM are normal.  Neck: Normal range of motion.  Cardiovascular: Normal rate and regular rhythm.   Pulmonary/Chest: Effort normal and breath sounds normal. No accessory muscle usage. No respiratory distress. He has no decreased breath sounds. He has no wheezes. He has no rhonchi. He has no rales.  Abdominal: Soft. He exhibits no distension.  Musculoskeletal: Normal range of motion.  Neurological: He is alert and oriented to person, place, and time.  Skin: Skin is warm and dry.  Psychiatric: He has a normal mood and affect. His behavior is normal. Thought content normal.  Nursing note and vitals reviewed.  ED Course  Procedures   DIAGNOSTIC STUDIES: Oxygen Saturation is 96% on room air, normal by my interpretation.    COORDINATION OF CARE: 9:44  PM Discussed treatment plan with pt including Rx for Tessalon Perles at bedside and pt agreed to plan. Strict return precautions given.   Labs Review Labs Reviewed - No data to display  Imaging Review Dg Chest 2 View  04/29/2015  CLINICAL DATA:  Initial evaluation for acute cough, chest congestion. EXAM: CHEST  2 VIEW COMPARISON:  Prior study from 12/08/2012. FINDINGS: The cardiac and mediastinal silhouettes are stable in size and contour, and remain within normal limits. The lungs are normally inflated. No airspace consolidation, pleural effusion, or pulmonary edema is identified. There is  no pneumothorax. No acute osseous abnormality identified. IMPRESSION: No active cardiopulmonary disease. Electronically Signed   By: Rise MuBenjamin  McClintock M.D.   On: 04/29/2015 21:37   I have personally reviewed and evaluated these images and lab results as part of my medical decision-making.   EKG Interpretation None      MDM   I have reviewed and evaluated the relevant imaging studies. I have reviewed the relevant previous healthcare records. I obtained HPI from historian.  ED Course:  Assessment: Pt is a 24yM who presents with cough since this morning after burning wood. On exam, pt in NAD. Nontoxic/nonseptic appearing. VSS. Afebrile. Lungs CTA. Heart RRR. CXR unremarkable. Given albuterol in ED with improvement of symptoms. Pt does not endorse headache, visual changes. Plan is to DC home with follow up to PCP. At time of discharge, Patient is in no acute distress. Vital Signs are stable. Patient is able to ambulate. Patient able to tolerate PO.    Disposition/Plan:  DC Home Additional Verbal discharge instructions given and discussed with patient.  Pt Instructed to f/u with PCP in the next week for evaluation and treatment of symptoms. Return precautions given Pt acknowledges and agrees with plan  Supervising Physician Loren Raceravid Yelverton, MD   Final diagnoses:  Cough  Viral syndrome    I personally performed the services described in this documentation, which was scribed in my presence. The recorded information has been reviewed and is accurate.   Audry Piliyler Orvilla Truett, PA-C 04/29/15 2217  Loren Raceravid Yelverton, MD 04/30/15 [phone]

## 2015-05-24 ENCOUNTER — Emergency Department (HOSPITAL_COMMUNITY): Payer: Self-pay | Admitting: Certified Registered Nurse Anesthetist

## 2015-05-24 ENCOUNTER — Encounter (HOSPITAL_COMMUNITY): Payer: Self-pay

## 2015-05-24 ENCOUNTER — Emergency Department (HOSPITAL_COMMUNITY): Payer: Self-pay

## 2015-05-24 ENCOUNTER — Ambulatory Visit (HOSPITAL_COMMUNITY)
Admission: EM | Admit: 2015-05-24 | Discharge: 2015-05-24 | Disposition: A | Discharge status: Home / Self Care | Payer: Self-pay | Attending: Emergency Medicine | Admitting: Emergency Medicine

## 2015-05-24 ENCOUNTER — Encounter (HOSPITAL_COMMUNITY)
Admission: EM | Disposition: A | Discharge status: Home / Self Care | Payer: Self-pay | Source: Home / Self Care | Attending: Emergency Medicine

## 2015-05-24 DIAGNOSIS — S025XXA Fracture of tooth (traumatic), initial encounter for closed fracture: Secondary | ICD-10-CM | POA: Insufficient documentation

## 2015-05-24 DIAGNOSIS — F17210 Nicotine dependence, cigarettes, uncomplicated: Secondary | ICD-10-CM | POA: Insufficient documentation

## 2015-05-24 DIAGNOSIS — S0990XA Unspecified injury of head, initial encounter: Secondary | ICD-10-CM

## 2015-05-24 DIAGNOSIS — S02642A Fracture of ramus of left mandible, initial encounter for closed fracture: Secondary | ICD-10-CM | POA: Insufficient documentation

## 2015-05-24 DIAGNOSIS — S02652A Fracture of angle of left mandible, initial encounter for closed fracture: Secondary | ICD-10-CM

## 2015-05-24 DIAGNOSIS — Y990 Civilian activity done for income or pay: Secondary | ICD-10-CM | POA: Insufficient documentation

## 2015-05-24 DIAGNOSIS — S02600B Fracture of unspecified part of body of mandible, initial encounter for open fracture: Secondary | ICD-10-CM

## 2015-05-24 HISTORY — PX: ORIF MANDIBULAR FRACTURE: SHX2127

## 2015-05-24 LAB — CBC
HEMATOCRIT: 48.7 % (ref 39.0–52.0)
Hemoglobin: 16.7 g/dL (ref 13.0–17.0)
MCH: 29.3 pg (ref 26.0–34.0)
MCHC: 34.3 g/dL (ref 30.0–36.0)
MCV: 85.6 fL (ref 78.0–100.0)
Platelets: 296 10*3/uL (ref 150–400)
RBC: 5.69 MIL/uL (ref 4.22–5.81)
RDW: 12.3 % (ref 11.5–15.5)
WBC: 9.4 10*3/uL (ref 4.0–10.5)

## 2015-05-24 SURGERY — OPEN REDUCTION INTERNAL FIXATION (ORIF) MANDIBULAR FRACTURE
Anesthesia: General | Laterality: Left

## 2015-05-24 MED ORDER — ROCURONIUM BROMIDE 50 MG/5ML IV SOLN
INTRAVENOUS | Status: AC
  Filled 2015-05-24: qty 1

## 2015-05-24 MED ORDER — PROPOFOL 10 MG/ML IV BOLUS
INTRAVENOUS | Status: AC
  Filled 2015-05-24: qty 20

## 2015-05-24 MED ORDER — OXYCODONE-ACETAMINOPHEN 5-325 MG PO TABS
1.0000 | ORAL_TABLET | Freq: Once | ORAL | Status: AC
  Administered 2015-05-24: 1 via ORAL
  Filled 2015-05-24: qty 1

## 2015-05-24 MED ORDER — DEXAMETHASONE SODIUM PHOSPHATE 4 MG/ML IJ SOLN
INTRAMUSCULAR | Status: DC | PRN
  Administered 2015-05-24: 10 mg via INTRAVENOUS

## 2015-05-24 MED ORDER — CEFAZOLIN SODIUM-DEXTROSE 2-4 GM/100ML-% IV SOLN
INTRAVENOUS | Status: AC
  Administered 2015-05-24: 2 g via INTRAVENOUS
  Filled 2015-05-24: qty 100

## 2015-05-24 MED ORDER — LACTATED RINGERS IV SOLN
INTRAVENOUS | Status: DC
  Administered 2015-05-24 (×2): via INTRAVENOUS

## 2015-05-24 MED ORDER — OXYMETAZOLINE HCL 0.05 % NA SOLN
NASAL | Status: AC
  Filled 2015-05-24: qty 15

## 2015-05-24 MED ORDER — PROPOFOL 10 MG/ML IV BOLUS
INTRAVENOUS | Status: DC | PRN
  Administered 2015-05-24: 50 mg via INTRAVENOUS
  Administered 2015-05-24: 200 mg via INTRAVENOUS

## 2015-05-24 MED ORDER — HYDROMORPHONE HCL 1 MG/ML IJ SOLN
INTRAMUSCULAR | Status: AC
  Filled 2015-05-24: qty 1

## 2015-05-24 MED ORDER — SUCCINYLCHOLINE CHLORIDE 20 MG/ML IJ SOLN
INTRAMUSCULAR | Status: DC | PRN
  Administered 2015-05-24: 100 mg via INTRAVENOUS

## 2015-05-24 MED ORDER — BACITRACIN ZINC 500 UNIT/GM EX OINT
TOPICAL_OINTMENT | CUTANEOUS | Status: AC
  Filled 2015-05-24: qty 28.35

## 2015-05-24 MED ORDER — ONDANSETRON HCL 4 MG/2ML IJ SOLN: 4.0000 mg | Freq: Four times a day (QID) | INTRAMUSCULAR | Status: DC | PRN

## 2015-05-24 MED ORDER — FENTANYL CITRATE (PF) 250 MCG/5ML IJ SOLN
INTRAMUSCULAR | Status: AC
  Filled 2015-05-24: qty 5

## 2015-05-24 MED ORDER — OXYCODONE HCL 5 MG/5ML PO SOLN: 5.0000 mg | Freq: Once | ORAL | Status: DC | PRN

## 2015-05-24 MED ORDER — OXYCODONE HCL 5 MG PO TABS: 5.0000 mg | ORAL_TABLET | Freq: Once | ORAL | Status: DC | PRN

## 2015-05-24 MED ORDER — MIDAZOLAM HCL 2 MG/2ML IJ SOLN
INTRAMUSCULAR | Status: AC
  Filled 2015-05-24: qty 2

## 2015-05-24 MED ORDER — ONDANSETRON HCL 4 MG/2ML IJ SOLN
INTRAMUSCULAR | Status: AC
  Filled 2015-05-24: qty 2

## 2015-05-24 MED ORDER — LIDOCAINE-EPINEPHRINE 1 %-1:100000 IJ SOLN
INTRAMUSCULAR | Status: AC
  Filled 2015-05-24: qty 1

## 2015-05-24 MED ORDER — MIDAZOLAM HCL 5 MG/5ML IJ SOLN
INTRAMUSCULAR | Status: DC | PRN
  Administered 2015-05-24: 2 mg via INTRAVENOUS

## 2015-05-24 MED ORDER — LIDOCAINE-EPINEPHRINE 1 %-1:100000 IJ SOLN
INTRAMUSCULAR | Status: DC | PRN
  Administered 2015-05-24: 4 mL

## 2015-05-24 MED ORDER — AMOXICILLIN-POT CLAVULANATE 250-62.5 MG/5ML PO SUSR: 10.0000 mL | Freq: Two times a day (BID) | ORAL | Status: DC

## 2015-05-24 MED ORDER — LIDOCAINE HCL (CARDIAC) 20 MG/ML IV SOLN
INTRAVENOUS | Status: DC | PRN
  Administered 2015-05-24: 60 mg via INTRAVENOUS

## 2015-05-24 MED ORDER — FENTANYL CITRATE (PF) 100 MCG/2ML IJ SOLN
INTRAMUSCULAR | Status: DC | PRN
  Administered 2015-05-24 (×4): 50 ug via INTRAVENOUS

## 2015-05-24 MED ORDER — ONDANSETRON HCL 4 MG/2ML IJ SOLN
INTRAMUSCULAR | Status: DC | PRN
  Administered 2015-05-24: 4 mg via INTRAVENOUS

## 2015-05-24 MED ORDER — HYDROMORPHONE HCL 1 MG/ML IJ SOLN
0.2500 mg | INTRAMUSCULAR | Status: DC | PRN
  Administered 2015-05-24: 0.25 mg via INTRAVENOUS

## 2015-05-24 MED ORDER — HYDROCODONE-ACETAMINOPHEN 7.5-325 MG/15ML PO SOLN: 10.0000 mL | ORAL | Status: DC | PRN

## 2015-05-24 SURGICAL SUPPLY — 44 items
BIT DRILL TWIST 1.6X58MM (BIT) ×1 IMPLANT
BLADE SURG 15 STRL LF DISP TIS (BLADE) IMPLANT
BLADE SURG 15 STRL SS (BLADE)
CANISTER SUCTION 2500CC (MISCELLANEOUS) ×3 IMPLANT
CLEANER TIP ELECTROSURG 2X2 (MISCELLANEOUS) ×3 IMPLANT
DRAPE PROXIMA HALF (DRAPES) IMPLANT
DRILL TWIST 1.6X58MM (BIT) ×3
ELECT COATED BLADE 2.86 ST (ELECTRODE) ×3 IMPLANT
ELECT NEEDLE TIP 2.8 STRL (NEEDLE) IMPLANT
ELECT REM PT RETURN 9FT ADLT (ELECTROSURGICAL) ×3
ELECTRODE REM PT RTRN 9FT ADLT (ELECTROSURGICAL) ×1 IMPLANT
GLOVE BIOGEL M 7.0 STRL (GLOVE) ×3 IMPLANT
GOWN STRL REUS W/ TWL LRG LVL3 (GOWN DISPOSABLE) ×2 IMPLANT
GOWN STRL REUS W/TWL LRG LVL3 (GOWN DISPOSABLE) ×4
KIT BASIN OR (CUSTOM PROCEDURE TRAY) ×3 IMPLANT
KIT ROOM TURNOVER OR (KITS) ×3 IMPLANT
NEEDLE HYPO 25GX1X1/2 BEV (NEEDLE) ×3 IMPLANT
NS IRRIG 1000ML POUR BTL (IV SOLUTION) ×3 IMPLANT
PAD ARMBOARD 7.5X6 YLW CONV (MISCELLANEOUS) ×6 IMPLANT
PATTIES SURGICAL .5 X3 (DISPOSABLE) IMPLANT
PENCIL BUTTON HOLSTER BLD 10FT (ELECTRODE) ×3 IMPLANT
SCISSORS WIRE DISP (INSTRUMENTS) ×3 IMPLANT
SCREW UPPER FACE 2.0X12MM (Screw) ×6 IMPLANT
SCREW UPPER FACE 2.0X8MM (Screw) ×6 IMPLANT
STAPLER VISISTAT 35W (STAPLE) ×3 IMPLANT
SUT BONE WAX W31G (SUTURE) IMPLANT
SUT CHROMIC 3 0 PS 2 (SUTURE) IMPLANT
SUT CHROMIC 3 0 SH 27 (SUTURE) ×3 IMPLANT
SUT ETHILON 4 0 P 3 18 (SUTURE) IMPLANT
SUT ETHILON 5 0 P 3 18 (SUTURE)
SUT NYLON ETHILON 5-0 P-3 1X18 (SUTURE) IMPLANT
SUT SILK 3 0 (SUTURE)
SUT SILK 3-0 18XBRD TIE 12 (SUTURE) IMPLANT
SUT STEEL 0 (SUTURE)
SUT STEEL 0 18XMFL TIE 17 (SUTURE) IMPLANT
SUT STEEL 2 (SUTURE) IMPLANT
SUT VIC AB 3-0 FS2 27 (SUTURE) IMPLANT
SUT VIC AB 4-0 P-3 18X BRD (SUTURE) IMPLANT
SUT VIC AB 4-0 P3 18 (SUTURE)
SUT VIC AB 5-0 P-3 18XBRD (SUTURE) IMPLANT
SUT VIC AB 5-0 P3 18 (SUTURE)
TOWEL OR 17X24 6PK STRL BLUE (TOWEL DISPOSABLE) ×3 IMPLANT
TRAY ENT MC OR (CUSTOM PROCEDURE TRAY) ×3 IMPLANT
WATER STERILE IRR 1000ML POUR (IV SOLUTION) IMPLANT

## 2015-05-24 NOTE — Brief Op Note (Signed)
05/24/2015  4:54 PM  PATIENT:  Terrence Shaw  25 y.o. male  PRE-OPERATIVE DIAGNOSIS:  left mandibular fracture  POST-OPERATIVE DIAGNOSIS:  same   PROCEDURE:  Procedure(s): OPEN REDUCTION INTERNAL FIXATION (ORIF) MANDIBULAR FRACTURE (Left)  SURGEON:  Surgeon(s) and Role:    * Osborn Cohoavid Chinwe Lope, MD - Primary  PHYSICIAN ASSISTANT:   ASSISTANTS: none   ANESTHESIA:   general  EBL:    min  BLOOD ADMINISTERED:none  DRAINS: none   LOCAL MEDICATIONS USED:  LIDOCAINE  and Amount: 4 ml  SPECIMEN:  No Specimen  DISPOSITION OF SPECIMEN:  N/A  COUNTS:  YES  TOURNIQUET:  * No tourniquets in log *  DICTATION: .Other Dictation: Dictation Number [id number]  PLAN OF CARE: Discharge to home after PACU  PATIENT DISPOSITION:  PACU - hemodynamically stable.   Delay start of Pharmacological VTE agent (>24hrs) due to surgical blood loss or risk of bleeding: not applicable

## 2015-05-24 NOTE — ED Notes (Signed)
Accepting RN made aware patient does not have IV access.  To place once patient is in short stay.
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
## 2015-05-24 NOTE — Anesthesia Procedure Notes (Signed)
Procedure Name: Intubation Date/Time: 05/24/2015 4:05 PM Performed by: Dairl PonderJIANG, Lindley Hiney Pre-anesthesia Checklist: Patient identified, Timeout performed, Emergency Drugs available, Suction available and Patient being monitored Patient Re-evaluated:Patient Re-evaluated prior to inductionOxygen Delivery Method: Circle system utilized Preoxygenation: Pre-oxygenation with 100% oxygen Intubation Type: IV induction Ventilation: Mask ventilation without difficulty Laryngoscope Size: Glidescope and 4 Grade View: Grade I Nasal Tubes: Nasal Rae Tube size: 7.5 mm Number of attempts: 1 Placement Confirmation: ETT inserted through vocal cords under direct vision,  positive ETCO2 and breath sounds checked- equal and bilateral Tube secured with: Tape Dental Injury: Teeth and Oropharynx as per pre-operative assessment
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
## 2015-05-24 NOTE — Discharge Instructions (Signed)
Concussion, Adult  A concussion, or closed-head injury, is a brain injury caused by a direct blow to the head or by a quick and sudden movement (jolt) of the head or neck. Concussions are usually not life-threatening. Even so, the effects of a concussion can be serious. If you have had a concussion before, you are more likely to experience concussion-like symptoms after a direct blow to the head.   CAUSES  · Direct blow to the head, such as from running into another player during a soccer game, being hit in a fight, or hitting your head on a hard surface.  · A jolt of the head or neck that causes the brain to move back and forth inside the skull, such as in a car crash.  SIGNS AND SYMPTOMS  The signs of a concussion can be hard to notice. Early on, they may be missed by you, family members, and health care providers. You may look fine but act or feel differently.  Symptoms are usually temporary, but they may last for days, weeks, or even longer. Some symptoms may appear right away while others may not show up for hours or days. Every head injury is different. Symptoms include:  · Mild to moderate headaches that will not go away.  · A feeling of pressure inside your head.  · Having more trouble than usual:    Learning or remembering things you have heard.    Answering questions.    Paying attention or concentrating.    Organizing daily tasks.    Making decisions and solving problems.  · Slowness in thinking, acting or reacting, speaking, or reading.  · Getting lost or being easily confused.  · Feeling tired all the time or lacking energy (fatigued).  · Feeling drowsy.  · Sleep disturbances.    Sleeping more than usual.    Sleeping less than usual.    Trouble falling asleep.    Trouble sleeping (insomnia).  · Loss of balance or feeling lightheaded or dizzy.  · Nausea or vomiting.  · Numbness or tingling.  · Increased sensitivity to:    Sounds.    Lights.    Distractions.  · Vision problems or eyes that tire  easily.  · Diminished sense of taste or smell.  · Ringing in the ears.  · Mood changes such as feeling sad or anxious.  · Becoming easily irritated or angry for little or no reason.  · Lack of motivation.  · Seeing or hearing things other people do not see or hear (hallucinations).  DIAGNOSIS  Your health care provider can usually diagnose a concussion based on a description of your injury and symptoms. He or she will ask whether you passed out (lost consciousness) and whether you are having trouble remembering events that happened right before and during your injury.  Your evaluation might include:  · A brain scan to look for signs of injury to the brain. Even if the test shows no injury, you may still have a concussion.  · Blood tests to be sure other problems are not present.  TREATMENT  · Concussions are usually treated in an emergency department, in urgent care, or at a clinic. You may need to stay in the hospital overnight for further treatment.  · Tell your health care provider if you are taking any medicines, including prescription medicines, over-the-counter medicines, and natural remedies. Some medicines, such as blood thinners (anticoagulants) and aspirin, may increase the chance of complications. Also tell your health care   provider whether you have had alcohol or are taking illegal drugs. This information may affect treatment.  · Your health care provider will send you home with important instructions to follow.  · How fast you will recover from a concussion depends on many factors. These factors include how severe your concussion is, what part of your brain was injured, your age, and how healthy you were before the concussion.  · Most people with mild injuries recover fully. Recovery can take time. In general, recovery is slower in older persons. Also, persons who have had a concussion in the past or have other medical problems may find that it takes longer to recover from their current injury.  HOME  CARE INSTRUCTIONS  General Instructions  · Carefully follow the directions your health care provider gave you.  · Only take over-the-counter or prescription medicines for pain, discomfort, or fever as directed by your health care provider.  · Take only those medicines that your health care provider has approved.  · Do not drink alcohol until your health care provider says you are well enough to do so. Alcohol and certain other drugs may slow your recovery and can put you at risk of further injury.  · If it is harder than usual to remember things, write them down.  · If you are easily distracted, try to do one thing at a time. For example, do not try to watch TV while fixing dinner.  · Talk with family members or close friends when making important decisions.  · Keep all follow-up appointments. Repeated evaluation of your symptoms is recommended for your recovery.  · Watch your symptoms and tell others to do the same. Complications sometimes occur after a concussion. Older adults with a brain injury may have a higher risk of serious complications, such as a blood clot on the brain.  · Tell your teachers, school nurse, school counselor, coach, athletic trainer, or work manager about your injury, symptoms, and restrictions. Tell them about what you can or cannot do. They should watch for:    Increased problems with attention or concentration.    Increased difficulty remembering or learning new information.    Increased time needed to complete tasks or assignments.    Increased irritability or decreased ability to cope with stress.    Increased symptoms.  · Rest. Rest helps the brain to heal. Make sure you:    Get plenty of sleep at night. Avoid staying up late at night.    Keep the same bedtime hours on weekends and weekdays.    Rest during the day. Take daytime naps or rest breaks when you feel tired.  · Limit activities that require a lot of thought or concentration. These include:    Doing homework or job-related  work.    Watching TV.    Working on the computer.  · Avoid any situation where there is potential for another head injury (football, hockey, soccer, basketball, martial arts, downhill snow sports and horseback riding). Your condition will get worse every time you experience a concussion. You should avoid these activities until you are evaluated by the appropriate follow-up health care providers.  Returning To Your Regular Activities  You will need to return to your normal activities slowly, not all at once. You must give your body and brain enough time for recovery.  · Do not return to sports or other athletic activities until your health care provider tells you it is safe to do so.  · Ask   your health care provider when you can drive, ride a bicycle, or operate heavy machinery. Your ability to react may be slower after a brain injury. Never do these activities if you are dizzy.  · Ask your health care provider about when you can return to work or school.  Preventing Another Concussion  It is very important to avoid another brain injury, especially before you have recovered. In rare cases, another injury can lead to permanent brain damage, brain swelling, or death. The risk of this is greatest during the first 7-10 days after a head injury. Avoid injuries by:  · Wearing a seat belt when riding in a car.  · Drinking alcohol only in moderation.  · Wearing a helmet when biking, skiing, skateboarding, skating, or doing similar activities.  · Avoiding activities that could lead to a second concussion, such as contact or recreational sports, until your health care provider says it is okay.  · Taking safety measures in your home.    Remove clutter and tripping hazards from floors and stairways.    Use grab bars in bathrooms and handrails by stairs.    Place non-slip mats on floors and in bathtubs.    Improve lighting in dim areas.  SEEK MEDICAL CARE IF:  · You have increased problems paying attention or  concentrating.  · You have increased difficulty remembering or learning new information.  · You need more time to complete tasks or assignments than before.  · You have increased irritability or decreased ability to cope with stress.  · You have more symptoms than before.  Seek medical care if you have any of the following symptoms for more than 2 weeks after your injury:  · Lasting (chronic) headaches.  · Dizziness or balance problems.  · Nausea.  · Vision problems.  · Increased sensitivity to noise or light.  · Depression or mood swings.  · Anxiety or irritability.  · Memory problems.  · Difficulty concentrating or paying attention.  · Sleep problems.  · Feeling tired all the time.  SEEK IMMEDIATE MEDICAL CARE IF:  · You have severe or worsening headaches. These may be a sign of a blood clot in the brain.  · You have weakness (even if only in one hand, leg, or part of the face).  · You have numbness.  · You have decreased coordination.  · You vomit repeatedly.  · You have increased sleepiness.  · One pupil is larger than the other.  · You have convulsions.  · You have slurred speech.  · You have increased confusion. This may be a sign of a blood clot in the brain.  · You have increased restlessness, agitation, or irritability.  · You are unable to recognize people or places.  · You have neck pain.  · It is difficult to wake you up.  · You have unusual behavior changes.  · You lose consciousness.  MAKE SURE YOU:  · Understand these instructions.  · Will watch your condition.  · Will get help right away if you are not doing well or get worse.     This information is not intended to replace advice given to you by your health care provider. Make sure you discuss any questions you have with your health care provider.     Document Released: 03/17/2003 Document Revised: 01/15/2014 Document Reviewed: 07/17/2012  Elsevier Interactive Patient Education ©2016 [employer]

## 2015-05-24 NOTE — Transfer of Care (Signed)
Immediate Anesthesia Transfer of Care Note  Patient: Terrence Shaw  Procedure(s) Performed: Procedure(s): OPEN REDUCTION INTERNAL FIXATION (ORIF) MANDIBULAR FRACTURE (Left)  Patient Location: PACU  Anesthesia Type:General  Level of Consciousness: awake, alert  and oriented  Airway & Oxygen Therapy: Patient Spontanous Breathing and Patient connected to nasal cannula oxygen  Post-op Assessment: Report given to RN and Post -op Vital signs reviewed and stable  Post vital signs: Reviewed and stable  Last Vitals:  Filed Vitals:   05/24/15 1400 05/24/15 1715  BP: 118/69 155/94  Pulse: 60 78  Temp:  36.5 C  Resp:  11    Last Pain:  Filed Vitals:   05/24/15 1724  PainSc: 10-Worst pain ever         Complications: No apparent anesthesia complications

## 2015-05-24 NOTE — Anesthesia Preprocedure Evaluation (Signed)
Anesthesia Evaluation  Patient identified by MRN, date of birth, ID band Patient awake    Reviewed: Allergy & Precautions, NPO status , Patient's Chart, lab work & pertinent test results  Airway Mallampati: II     Mouth opening: Limited Mouth Opening  Dental   Pulmonary Current Smoker,    breath sounds clear to auscultation       Cardiovascular negative cardio ROS   Rhythm:regular Rate:Normal     Neuro/Psych PSYCHIATRIC DISORDERS Depression    GI/Hepatic GERD  ,  Endo/Other    Renal/GU      Musculoskeletal   Abdominal   Peds  Hematology   Anesthesia Other Findings   Reproductive/Obstetrics                             Anesthesia Physical Anesthesia Plan  ASA: II  Anesthesia Plan: General   Post-op Pain Management:    Induction: Intravenous  Airway Management Planned: Nasal ETT  Additional Equipment:   Intra-op Plan:   Post-operative Plan: Extubation in OR  Informed Consent: I have reviewed the patients History and Physical, chart, labs and discussed the procedure including the risks, benefits and alternatives for the proposed anesthesia with the patient or authorized representative who has indicated his/her understanding and acceptance.     Plan Discussed with: CRNA, Anesthesiologist and Surgeon  Anesthesia Plan Comments:         Anesthesia Quick Evaluation

## 2015-05-25 ENCOUNTER — Encounter (HOSPITAL_COMMUNITY): Payer: Self-pay | Admitting: Otolaryngology
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
## 2015-05-25 NOTE — Anesthesia Postprocedure Evaluation (Signed)
Anesthesia Post Note  Patient: Terrence Shaw  Procedure(s) Performed: Procedure(s) (LRB): OPEN REDUCTION INTERNAL FIXATION (ORIF) MANDIBULAR FRACTURE (Left)  Patient location during evaluation: PACU Anesthesia Type: General Level of consciousness: awake and alert and patient cooperative Pain management: pain level controlled Vital Signs Assessment: post-procedure vital signs reviewed and stable Respiratory status: spontaneous breathing and respiratory function stable Cardiovascular status: stable Anesthetic complications: no    Last Vitals:  Filed Vitals:   05/24/15 1745 05/24/15 1800  BP: 151/99 157/94  Pulse: 52 51  Temp:  36.6 C  Resp: 12 10    Last Pain:  Filed Vitals:   05/24/15 1803  PainSc: 7                  [name] S

## 2015-07-07 ENCOUNTER — Other Ambulatory Visit: Payer: Self-pay | Admitting: Otolaryngology

## 2015-07-13 ENCOUNTER — Encounter (HOSPITAL_BASED_OUTPATIENT_CLINIC_OR_DEPARTMENT_OTHER): Payer: Self-pay | Admitting: *Deleted

## 2015-07-19 ENCOUNTER — Encounter (HOSPITAL_BASED_OUTPATIENT_CLINIC_OR_DEPARTMENT_OTHER): Payer: Self-pay | Admitting: *Deleted

## 2015-07-19 ENCOUNTER — Ambulatory Visit (HOSPITAL_BASED_OUTPATIENT_CLINIC_OR_DEPARTMENT_OTHER): Payer: Self-pay | Admitting: Anesthesiology

## 2015-07-19 ENCOUNTER — Ambulatory Visit (HOSPITAL_BASED_OUTPATIENT_CLINIC_OR_DEPARTMENT_OTHER)
Admission: RE | Admit: 2015-07-19 | Discharge: 2015-07-19 | Disposition: A | Discharge status: Home / Self Care | Payer: Self-pay | Source: Ambulatory Visit | Attending: Otolaryngology | Admitting: Otolaryngology

## 2015-07-19 ENCOUNTER — Encounter (HOSPITAL_BASED_OUTPATIENT_CLINIC_OR_DEPARTMENT_OTHER)
Admission: RE | Disposition: A | Discharge status: Home / Self Care | Payer: Self-pay | Source: Ambulatory Visit | Attending: Otolaryngology

## 2015-07-19 DIAGNOSIS — Z472 Encounter for removal of internal fixation device: Secondary | ICD-10-CM | POA: Insufficient documentation

## 2015-07-19 DIAGNOSIS — F17210 Nicotine dependence, cigarettes, uncomplicated: Secondary | ICD-10-CM | POA: Insufficient documentation

## 2015-07-19 HISTORY — PX: MANDIBULAR HARDWARE REMOVAL: SHX5205

## 2015-07-19 SURGERY — REMOVAL, HARDWARE, MANDIBLE
Anesthesia: General | Site: Mouth

## 2015-07-19 MED ORDER — HYDROCODONE-ACETAMINOPHEN 5-325 MG PO TABS
ORAL_TABLET | ORAL | Status: AC
  Filled 2015-07-19: qty 1

## 2015-07-19 MED ORDER — MIDAZOLAM HCL 2 MG/2ML IJ SOLN
INTRAMUSCULAR | Status: AC
  Filled 2015-07-19: qty 2

## 2015-07-19 MED ORDER — NEOMYCIN-POLYMYXIN-DEXAMETH 3.5-10000-0.1 OP OINT
TOPICAL_OINTMENT | OPHTHALMIC | Status: AC
  Filled 2015-07-19: qty 3.5

## 2015-07-19 MED ORDER — FENTANYL CITRATE (PF) 100 MCG/2ML IJ SOLN
INTRAMUSCULAR | Status: AC
  Filled 2015-07-19: qty 2

## 2015-07-19 MED ORDER — LACTATED RINGERS IV SOLN
INTRAVENOUS | Status: DC
  Administered 2015-07-19 (×2): via INTRAVENOUS

## 2015-07-19 MED ORDER — SCOPOLAMINE 1 MG/3DAYS TD PT72: 1.0000 | MEDICATED_PATCH | Freq: Once | TRANSDERMAL | Status: DC | PRN

## 2015-07-19 MED ORDER — FENTANYL CITRATE (PF) 100 MCG/2ML IJ SOLN
25.0000 ug | INTRAMUSCULAR | Status: DC | PRN
  Administered 2015-07-19 (×2): 50 ug via INTRAVENOUS
  Administered 2015-07-19 (×2): 25 ug via INTRAVENOUS

## 2015-07-19 MED ORDER — CEFAZOLIN SODIUM-DEXTROSE 2-4 GM/100ML-% IV SOLN
INTRAVENOUS | Status: AC
  Filled 2015-07-19: qty 100

## 2015-07-19 MED ORDER — ONDANSETRON HCL 4 MG/2ML IJ SOLN
INTRAMUSCULAR | Status: AC
  Filled 2015-07-19: qty 2

## 2015-07-19 MED ORDER — LIDOCAINE 2% (20 MG/ML) 5 ML SYRINGE
INTRAMUSCULAR | Status: AC
  Filled 2015-07-19: qty 5

## 2015-07-19 MED ORDER — LIDOCAINE-EPINEPHRINE 1 %-1:100000 IJ SOLN
INTRAMUSCULAR | Status: AC
  Filled 2015-07-19: qty 1

## 2015-07-19 MED ORDER — GLYCOPYRROLATE 0.2 MG/ML IJ SOLN
0.2000 mg | Freq: Once | INTRAMUSCULAR | Status: DC | PRN
Start: 2015-07-19 — End: 2015-07-19

## 2015-07-19 MED ORDER — CEFAZOLIN SODIUM-DEXTROSE 2-4 GM/100ML-% IV SOLN
2.0000 g | INTRAVENOUS | Status: AC
  Administered 2015-07-19: 2 g via INTRAVENOUS

## 2015-07-19 MED ORDER — LIDOCAINE-EPINEPHRINE 1 %-1:100000 IJ SOLN
INTRAMUSCULAR | Status: DC | PRN
  Administered 2015-07-19: 2 mL

## 2015-07-19 MED ORDER — HYDROCODONE-ACETAMINOPHEN 5-325 MG PO TABS
1.0000 | ORAL_TABLET | Freq: Once | ORAL | Status: AC
  Administered 2015-07-19: 1 via ORAL

## 2015-07-19 MED ORDER — CHLORHEXIDINE GLUCONATE CLOTH 2 % EX PADS: 6.0000 | MEDICATED_PAD | Freq: Once | CUTANEOUS | Status: DC

## 2015-07-19 MED ORDER — HYDROCODONE-ACETAMINOPHEN 5-325 MG PO TABS: 1.0000 | ORAL_TABLET | Freq: Four times a day (QID) | ORAL | Status: DC | PRN

## 2015-07-19 MED ORDER — NEOMYCIN-POLYMYXIN-DEXAMETH 3.5-10000-0.1 OP OINT
TOPICAL_OINTMENT | OPHTHALMIC | Status: DC | PRN
  Administered 2015-07-19: 1

## 2015-07-19 MED ORDER — AMOXICILLIN-POT CLAVULANATE 500-125 MG PO TABS: 1.0000 | ORAL_TABLET | Freq: Two times a day (BID) | ORAL | Status: DC

## 2015-07-19 MED ORDER — FENTANYL CITRATE (PF) 100 MCG/2ML IJ SOLN
50.0000 ug | INTRAMUSCULAR | Status: DC | PRN
  Administered 2015-07-19: 50 ug via INTRAVENOUS

## 2015-07-19 MED ORDER — DEXAMETHASONE SODIUM PHOSPHATE 10 MG/ML IJ SOLN
INTRAMUSCULAR | Status: AC
  Filled 2015-07-19: qty 1

## 2015-07-19 MED ORDER — ONDANSETRON HCL 4 MG/2ML IJ SOLN
INTRAMUSCULAR | Status: DC | PRN
  Administered 2015-07-19: 4 mg via INTRAVENOUS

## 2015-07-19 MED ORDER — PROMETHAZINE HCL 25 MG/ML IJ SOLN: 6.2500 mg | INTRAMUSCULAR | Status: DC | PRN

## 2015-07-19 MED ORDER — DEXAMETHASONE SODIUM PHOSPHATE 10 MG/ML IJ SOLN
10.0000 mg | Freq: Once | INTRAMUSCULAR | Status: AC
  Administered 2015-07-19: 10 mg via INTRAVENOUS

## 2015-07-19 MED ORDER — MIDAZOLAM HCL 2 MG/2ML IJ SOLN
1.0000 mg | INTRAMUSCULAR | Status: DC | PRN
  Administered 2015-07-19: 2 mg via INTRAVENOUS

## 2015-07-19 MED ORDER — PROPOFOL 10 MG/ML IV BOLUS
INTRAVENOUS | Status: AC
  Filled 2015-07-19: qty 20

## 2015-07-19 MED ORDER — PROPOFOL 10 MG/ML IV BOLUS
INTRAVENOUS | Status: DC | PRN
  Administered 2015-07-19: 150 mg via INTRAVENOUS

## 2015-07-19 SURGICAL SUPPLY — 35 items
BLADE SURG 15 STRL LF DISP TIS (BLADE) IMPLANT
BLADE SURG 15 STRL SS (BLADE)
CANISTER SUCT 1200ML W/VALVE (MISCELLANEOUS) ×3 IMPLANT
COVER MAYO STAND STRL (DRAPES) ×3 IMPLANT
DECANTER SPIKE VIAL GLASS SM (MISCELLANEOUS) ×3 IMPLANT
ELECT COATED BLADE 2.86 ST (ELECTRODE) ×3 IMPLANT
ELECT REM PT RETURN 9FT ADLT (ELECTROSURGICAL) ×3
ELECTRODE REM PT RTRN 9FT ADLT (ELECTROSURGICAL) ×1 IMPLANT
GAUZE SPONGE 4X4 16PLY XRAY LF (GAUZE/BANDAGES/DRESSINGS) IMPLANT
GLOVE BIO SURGEON STRL SZ7.5 (GLOVE) ×3 IMPLANT
GLOVE BIOGEL M 7.0 STRL (GLOVE) ×3 IMPLANT
GLOVE BIOGEL PI IND STRL 7.0 (GLOVE) ×1 IMPLANT
GLOVE BIOGEL PI INDICATOR 7.0 (GLOVE) ×2
GLOVE SURG SS PI 6.5 STRL IVOR (GLOVE) ×3 IMPLANT
GLOVE SURG SS PI 7.5 STRL IVOR (GLOVE) ×3 IMPLANT
GOWN STRL REUS W/ TWL LRG LVL3 (GOWN DISPOSABLE) ×1 IMPLANT
GOWN STRL REUS W/TWL LRG LVL3 (GOWN DISPOSABLE) ×2
MARKER SKIN DUAL TIP RULER LAB (MISCELLANEOUS) IMPLANT
NEEDLE PRECISIONGLIDE 27X1.5 (NEEDLE) ×3 IMPLANT
NS IRRIG 1000ML POUR BTL (IV SOLUTION) ×3 IMPLANT
PACK BASIN DAY SURGERY FS (CUSTOM PROCEDURE TRAY) ×3 IMPLANT
PENCIL BUTTON HOLSTER BLD 10FT (ELECTRODE) ×3 IMPLANT
SCISSORS WIRE ANG 4 3/4 DISP (INSTRUMENTS) IMPLANT
SHEET MEDIUM DRAPE 40X70 STRL (DRAPES) ×3 IMPLANT
SPONGE GAUZE 4X4 12PLY STER LF (GAUZE/BANDAGES/DRESSINGS) ×6 IMPLANT
SUT CHROMIC 3 0 PS 2 (SUTURE) IMPLANT
SUT CHROMIC 4 0 PS 2 18 (SUTURE) IMPLANT
SUT CHROMIC 4 0 RB 1X27 (SUTURE) ×3 IMPLANT
SYR BULB 3OZ (MISCELLANEOUS) ×3 IMPLANT
SYR CONTROL 10ML LL (SYRINGE) ×3 IMPLANT
TOWEL OR 17X24 6PK STRL BLUE (TOWEL DISPOSABLE) IMPLANT
TRAY DSU PREP LF (CUSTOM PROCEDURE TRAY) IMPLANT
TUBE CONNECTING 20'X1/4 (TUBING) ×1
TUBE CONNECTING 20X1/4 (TUBING) ×2 IMPLANT
YANKAUER SUCT BULB TIP NO VENT (SUCTIONS) IMPLANT

## 2015-07-19 NOTE — Anesthesia Preprocedure Evaluation (Addendum)
Anesthesia Evaluation  Patient identified by MRN, date of birth, ID band Patient awake    Reviewed: Allergy & Precautions, NPO status , Patient's Chart, lab work & pertinent test results  Airway   TM Distance: >3 FB Neck ROM: Full  Mouth opening: Limited Mouth Opening Comment: MANDIBULOMAXILLARY FIXATION WIRERS  Dental  (+) Dental Advisory Given, Poor Dentition, Chipped   Pulmonary Current Smoker,    Pulmonary exam normal breath sounds clear to auscultation       Cardiovascular negative cardio ROS Normal cardiovascular exam Rhythm:Regular Rate:Normal     Neuro/Psych PSYCHIATRIC DISORDERS Depression negative neurological ROS     GI/Hepatic Neg liver ROS, GERD  ,  Endo/Other  negative endocrine ROS  Renal/GU negative Renal ROS     Musculoskeletal negative musculoskeletal ROS (+)   Abdominal   Peds  Hematology negative hematology ROS (+)   Anesthesia Other Findings Day of surgery medications reviewed with the patient.  Reproductive/Obstetrics                          Anesthesia Physical Anesthesia Plan  ASA: II  Anesthesia Plan: General   Post-op Pain Management:    Induction: Intravenous  Airway Management Planned: Mask  Additional Equipment:   Intra-op Plan:   Post-operative Plan:   Informed Consent: I have reviewed the patients History and Physical, chart, labs and discussed the procedure including the risks, benefits and alternatives for the proposed anesthesia with the patient or authorized representative who has indicated his/her understanding and acceptance.   Dental advisory given  Plan Discussed with: CRNA  Anesthesia Plan Comments: (GA with intermittent mask ventilation.)      Anesthesia Quick Evaluation

## 2015-07-19 NOTE — Discharge Instructions (Signed)

## 2015-07-19 NOTE — H&P (Signed)
Terrence SpittleHenry Shaw is an 25 y.o. male.   Chief Complaint: Mandible fracture HPI: Pt with Mandible fracture and MMF  Past Medical History  Diagnosis Date  . GERD (gastroesophageal reflux disease)   . Depression     Past Surgical History  Procedure Laterality Date  . Wisdom tooth extraction    . Orif mandibular fracture Left 05/24/2015    Procedure: OPEN REDUCTION INTERNAL FIXATION (ORIF) MANDIBULAR FRACTURE;  Surgeon: Osborn Cohoavid Chaske Paskett, MD;  Location: [facility] OR;  Service: ENT;  Laterality: Left;    History reviewed. No pertinent family history. Social History:  reports that he has been smoking Cigarettes.  He has been smoking about 1.00 pack per day. He has never used smokeless tobacco. He reports that he does not drink alcohol or use illicit drugs.  Allergies: No Known Allergies  No prescriptions prior to admission    No results found for this or any previous visit (from the past 48 hour(s)). No results found.  Review of Systems  Constitutional: Negative.   HENT: Negative.   Respiratory: Negative.   Cardiovascular: Negative.     Blood pressure 120/62, pulse 50, temperature 98.2 F (36.8 C), temperature source Oral, resp. rate 16, height 5\' 11"  (1.803 m), weight 73.483 kg (162 lb), SpO2 99 %. Physical Exam  Constitutional: He appears well-developed.  HENT:  MMF in place  Neck: Normal range of motion. Neck supple.  Cardiovascular: Normal rate.   Respiratory: Effort normal.     Assessment/Plan Adm for OP removal of MMF hardware  Marvia Troost, MD 07/19/2015, 11:04 AM

## 2015-07-19 NOTE — Anesthesia Postprocedure Evaluation (Signed)
Anesthesia Post Note  Patient: Terrence Shaw  Procedure(s) Performed: Procedure(s) (LRB): REMOVAL OF MANDIBULOMAXILLARY FIXATION WIRERS (N/A)  Patient location during evaluation: PACU Anesthesia Type: General Level of consciousness: awake and alert Pain management: pain level controlled Vital Signs Assessment: post-procedure vital signs reviewed and stable Respiratory status: spontaneous breathing, nonlabored ventilation, respiratory function stable and patient connected to nasal cannula oxygen Cardiovascular status: blood pressure returned to baseline and stable Postop Assessment: no signs of nausea or vomiting Anesthetic complications: no    Last Vitals:  Filed Vitals:   07/19/15 1248 07/19/15 1302  BP:  120/67  Pulse: 63 65  Temp:  37 C  Resp: 10 16    Last Pain:  Filed Vitals:   07/19/15 1303  PainSc: 6                  Terrence Shaw

## 2015-07-19 NOTE — Transfer of Care (Signed)
Immediate Anesthesia Transfer of Care Note  Patient: Shaune SpittleHenry Passero  Procedure(s) Performed: Procedure(s): REMOVAL OF MANDIBULOMAXILLARY FIXATION Herma MeringWIRERS (N/A)  Patient Location: PACU  Anesthesia Type:General  Level of Consciousness: awake, sedated and patient cooperative  Airway & Oxygen Therapy: Patient Spontanous Breathing and Patient connected to face mask oxygen  Post-op Assessment: Report given to RN and Post -op Vital signs reviewed and stable  Post vital signs: Reviewed and stable  Last Vitals:  Filed Vitals:   07/19/15 0913  BP: 120/62  Pulse: 50  Temp: 36.8 C  Resp: 16    Last Pain:  Filed Vitals:   07/19/15 0918  PainSc: 8       Patients Stated Pain Goal: 4 (07/19/15 0913)  Complications: No apparent anesthesia complications

## 2015-07-19 NOTE — Brief Op Note (Signed)
07/19/2015  11:45 AM  PATIENT:  Terrence Shaw  25 y.o. male  PRE-OPERATIVE DIAGNOSIS:  jaw fracture  POST-OPERATIVE DIAGNOSIS:  jaw fracture  PROCEDURE:  Procedure(s): REMOVAL OF MANDIBULOMAXILLARY FIXATION Herma MeringWIRERS (N/A)  SURGEON:  Surgeon(s) and Role:    * Osborn Cohoavid Rankin Coolman, MD - Primary  PHYSICIAN ASSISTANT:   ASSISTANTS: none   ANESTHESIA:   general  EBL:  Total I/O In: 1000 [I.V.:1000] Out: - Min  BLOOD ADMINISTERED:none  DRAINS: none   LOCAL MEDICATIONS USED:  LIDOCAINE  and Amount: 2 ml  SPECIMEN:  No Specimen  DISPOSITION OF SPECIMEN:  N/A  COUNTS:  YES  TOURNIQUET:  * No tourniquets in log *  DICTATION: .Other Dictation: Dictation Number [id number]  PLAN OF CARE: Discharge to home after PACU  PATIENT DISPOSITION:  PACU - hemodynamically stable.   Delay start of Pharmacological VTE agent (>24hrs) due to surgical blood loss or risk of bleeding: not applicable
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
## 2015-07-21 ENCOUNTER — Encounter (HOSPITAL_BASED_OUTPATIENT_CLINIC_OR_DEPARTMENT_OTHER): Payer: Self-pay | Admitting: Otolaryngology

## 2016-02-23 ENCOUNTER — Emergency Department (HOSPITAL_COMMUNITY)
Admission: EM | Admit: 2016-02-23 | Discharge: 2016-02-23 | Disposition: A | Discharge status: Home / Self Care | Payer: Self-pay | Attending: Emergency Medicine | Admitting: Emergency Medicine

## 2016-02-23 DIAGNOSIS — F17210 Nicotine dependence, cigarettes, uncomplicated: Secondary | ICD-10-CM | POA: Insufficient documentation

## 2016-02-23 DIAGNOSIS — Z202 Contact with and (suspected) exposure to infections with a predominantly sexual mode of transmission: Secondary | ICD-10-CM | POA: Insufficient documentation

## 2016-02-23 MED ORDER — AZITHROMYCIN 250 MG PO TABS
1000.0000 mg | ORAL_TABLET | Freq: Once | ORAL | Status: AC
  Administered 2016-02-23: 1000 mg via ORAL
  Filled 2016-02-23: qty 4

## 2016-02-23 MED ORDER — METRONIDAZOLE 500 MG PO TABS
2000.0000 mg | ORAL_TABLET | Freq: Once | ORAL | Status: AC
  Administered 2016-02-23: 2000 mg via ORAL
  Filled 2016-02-23: qty 4

## 2016-02-23 MED ORDER — CEFTRIAXONE SODIUM 250 MG IJ SOLR
250.0000 mg | Freq: Once | INTRAMUSCULAR | Status: AC
  Administered 2016-02-23: 250 mg via INTRAMUSCULAR
  Filled 2016-02-23: qty 250

## 2016-02-23 NOTE — ED Notes (Signed)
Pt denies any symptoms, states he had unprotected sex with a male who tested positive for gonorrhea and he wants to be treated.

## 2016-02-23 NOTE — ED Provider Notes (Signed)
[facility]-EMERGENCY DEPT Provider Note   CSN: [id number] Arrival date & time: 02/23/16  1824   By signing my name below, I, Teofilo Pod, attest that this documentation has been prepared under the direction and in the presence of Buel Ream, PA-C. Electronically Signed: Teofilo Pod, ED Scribe. 02/23/2016. 7:54 PM.   History   Chief Complaint Chief Complaint  Patient presents with  . Exposure to STD    The history is provided by the patient. No language interpreter was used.   HPI Comments:  Terrence Shaw is a 26 y.o. male who presents to the Emergency Department, here for an STD screening following unprotected sex last night. Pt reports that he had unprotected sex with a male partner last night and wants to confirm whether or not he contracted an STD. Pt states that his partner was diagnosed with gonorrhea today. Pt is only sexually active with women. Pt states that he does not have any symptoms at this time. Pt denies fever, penile pain/discharge, testicular swelling.    Past Medical History:  Diagnosis Date  . Depression   . GERD (gastroesophageal reflux disease)     Patient Active Problem List   Diagnosis Date Noted  . Open body of mandible fracture (HCC) 05/24/2015    Past Surgical History:  Procedure Laterality Date  . MANDIBULAR HARDWARE REMOVAL N/A 07/19/2015   Procedure: REMOVAL OF MANDIBULOMAXILLARY FIXATION Herma Mering;  Surgeon: Osborn Coho, MD;  Location: [facility];  Service: ENT;  Laterality: N/A;  . ORIF MANDIBULAR FRACTURE Left 05/24/2015   Procedure: OPEN REDUCTION INTERNAL FIXATION (ORIF) MANDIBULAR FRACTURE;  Surgeon: Osborn Coho, MD;  Location: [facility];  Service: ENT;  Laterality: Left;  . WISDOM TOOTH EXTRACTION         Home Medications    Prior to Admission medications   Medication Sig Start Date End Date Taking? Authorizing Provider  amoxicillin-clavulanate (AUGMENTIN) 500-125 MG tablet Take 1 tablet (500 mg total) by  mouth 2 (two) times daily. 07/19/15   Osborn Coho, MD  HYDROcodone-acetaminophen (NORCO) 5-325 MG tablet Take 1 tablet by mouth every 6 (six) hours as needed. 07/19/15   Osborn Coho, MD    Family History No family history on file.  Social History Social History  Substance Use Topics  . Smoking status: Current Every Day Smoker    Packs/day: 1.00    Types: Cigarettes  . Smokeless tobacco: Never Used  . Alcohol use No     Allergies   Patient has no known allergies.   Review of Systems Review of Systems  Constitutional: Negative for chills and fever.  Genitourinary: Negative for discharge, dysuria, penile pain, penile swelling, scrotal swelling and testicular pain.       Positive for STD exposure.   Skin: Negative for rash and wound.  Psychiatric/Behavioral: The patient is not nervous/anxious.      Physical Exam Updated Vital Signs BP 131/84 (BP Location: Right Arm)   Pulse 72   Temp 98 F (36.7 C) (Oral)   Resp 18   Ht 5\' 11"  (1.803 m)   Wt 79.8 kg   SpO2 99%   BMI 24.55 kg/m   Physical Exam  Constitutional: He appears well-developed and well-nourished. No distress.  HENT:  Head: Normocephalic and atraumatic.  Mouth/Throat: Oropharynx is clear and moist. No oropharyngeal exudate.  Eyes: Conjunctivae are normal. Pupils are equal, round, and reactive to light. Right eye exhibits no discharge. Left eye exhibits no discharge. No scleral icterus.  Neck: Normal range of  motion. Neck supple. No thyromegaly present.  Cardiovascular: Normal rate, regular rhythm, normal heart sounds and intact distal pulses.  Exam reveals no gallop and no friction rub.   No murmur heard. Pulmonary/Chest: Effort normal and breath sounds normal. No stridor. No respiratory distress. He has no wheezes. He has no rales.  Abdominal: Soft. Bowel sounds are normal. He exhibits no distension. There is no tenderness. There is no rebound and no guarding.  Genitourinary: Testes normal and penis  normal. No discharge found.  Musculoskeletal: He exhibits no edema.  Lymphadenopathy:    He has no cervical adenopathy.  Neurological: He is alert. Coordination normal.  Skin: Skin is warm and dry. No rash noted. He is not diaphoretic. No pallor.  Psychiatric: He has a normal mood and affect.  Nursing note and vitals reviewed.    ED Treatments / Results  DIAGNOSTIC STUDIES:  Oxygen Saturation is 99% on RA, normal by my interpretation.    COORDINATION OF CARE:  7:54 PM STD screening ordered. Discussed treatment plan with pt at bedside and pt agreed to plan.   Labs (all labs ordered are listed, but only abnormal results are displayed) Labs Reviewed  GC/CHLAMYDIA PROBE AMP ([place]) NOT AT [facility]    EKG  EKG Interpretation None       Radiology No results found.  Procedures Procedures (including critical care time)  Medications Ordered in ED Medications  cefTRIAXone (ROCEPHIN) injection 250 mg (not administered)  azithromycin (ZITHROMAX) tablet 1,000 mg (not administered)  metroNIDAZOLE (FLAGYL) tablet 2,000 mg (not administered)     Initial Impression / Assessment and Plan / ED Course  I have reviewed the triage vital signs and the nursing notes.  Pertinent labs & imaging results that were available during my care of the patient were reviewed by me and considered in my medical decision making (see chart for details).     Patient treated in the ED for STI with Rocephin, azithromycin, Flagyl. Patient advised to inform and treat all sexual partners.  Pt advised on safe sex practices and understands that they have GC/Chlamydia cultures pending and will result in 2-3 days. Referred patient to health department for HIV and RPR considering time since exposure. Pt encouraged to follow up at health department for future STI checks, as well. No concern for prostatitis or epididymitis. Discussed return precautions. Pt appears safe for discharge.    Final Clinical  Impressions(s) / ED Diagnoses   Final diagnoses:  Exposure to STD    New Prescriptions New Prescriptions   No medications on file  I personally performed the services described in this documentation, which was scribed in my presence. The recorded information has been reviewed and is accurate.     Emi Holeslexandra M Timithy Arons, PA-C 02/23/16 2005    Derwood KaplanAnkit Nanavati, MD 02/23/16 2033

## 2016-02-23 NOTE — Discharge Instructions (Signed)
You will be called in 2-3 days if any of your results return positive. You have are being treated for gonorrhea, chlamydia, and trichomonas. Please follow-up health department for future STD checks, in addition to HIV and syphilis testing. Please return to emergency department if you develop any new or worsening symptoms.

## 2016-02-23 NOTE — ED Triage Notes (Signed)
Pt states "I had unprotected sex with a lady last night and I want to make sure I don't have anything" Other party has no sx and has not been tested. Pt has no sx at this time.

## 2016-02-24 LAB — GC/CHLAMYDIA PROBE AMP (~~LOC~~) NOT AT ARMC
Chlamydia: NEGATIVE
Neisseria Gonorrhea: NEGATIVE

## 2017-07-09 ENCOUNTER — Emergency Department (HOSPITAL_BASED_OUTPATIENT_CLINIC_OR_DEPARTMENT_OTHER)
Admission: EM | Admit: 2017-07-09 | Discharge: 2017-07-09 | Disposition: A | Discharge status: Home / Self Care | Payer: Self-pay | Attending: Emergency Medicine | Admitting: Emergency Medicine

## 2017-07-09 ENCOUNTER — Emergency Department (HOSPITAL_BASED_OUTPATIENT_CLINIC_OR_DEPARTMENT_OTHER): Payer: Self-pay

## 2017-07-09 ENCOUNTER — Other Ambulatory Visit: Payer: Self-pay

## 2017-07-09 ENCOUNTER — Encounter (HOSPITAL_BASED_OUTPATIENT_CLINIC_OR_DEPARTMENT_OTHER): Payer: Self-pay | Admitting: Emergency Medicine

## 2017-07-09 DIAGNOSIS — J069 Acute upper respiratory infection, unspecified: Secondary | ICD-10-CM | POA: Insufficient documentation

## 2017-07-09 DIAGNOSIS — R059 Cough, unspecified: Secondary | ICD-10-CM

## 2017-07-09 DIAGNOSIS — R110 Nausea: Secondary | ICD-10-CM | POA: Insufficient documentation

## 2017-07-09 DIAGNOSIS — Z720 Tobacco use: Secondary | ICD-10-CM

## 2017-07-09 DIAGNOSIS — R05 Cough: Secondary | ICD-10-CM

## 2017-07-09 DIAGNOSIS — F17210 Nicotine dependence, cigarettes, uncomplicated: Secondary | ICD-10-CM | POA: Insufficient documentation

## 2017-07-09 MED ORDER — IPRATROPIUM-ALBUTEROL 0.5-2.5 (3) MG/3ML IN SOLN
3.0000 mL | Freq: Once | RESPIRATORY_TRACT | Status: AC
  Administered 2017-07-09: 3 mL via RESPIRATORY_TRACT
  Filled 2017-07-09: qty 3

## 2017-07-09 MED ORDER — ALBUTEROL SULFATE (2.5 MG/3ML) 0.083% IN NEBU
2.5000 mg | INHALATION_SOLUTION | Freq: Once | RESPIRATORY_TRACT | Status: AC
  Administered 2017-07-09: 2.5 mg via RESPIRATORY_TRACT
  Filled 2017-07-09: qty 3

## 2017-07-09 MED ORDER — ALBUTEROL SULFATE HFA 108 (90 BASE) MCG/ACT IN AERS
2.0000 | INHALATION_SPRAY | Freq: Once | RESPIRATORY_TRACT | Status: AC
  Administered 2017-07-09: 2 via RESPIRATORY_TRACT
  Filled 2017-07-09: qty 6.7

## 2017-07-09 MED ORDER — ALBUTEROL SULFATE (2.5 MG/3ML) 0.083% IN NEBU: 5.0000 mg | INHALATION_SOLUTION | Freq: Once | RESPIRATORY_TRACT | Status: DC

## 2017-07-09 MED ORDER — DEXAMETHASONE SODIUM PHOSPHATE 10 MG/ML IJ SOLN
10.0000 mg | Freq: Once | INTRAMUSCULAR | Status: AC
  Administered 2017-07-09: 10 mg via INTRAMUSCULAR
  Filled 2017-07-09: qty 1

## 2017-07-09 MED ORDER — IPRATROPIUM BROMIDE 0.02 % IN SOLN: 0.5000 mg | Freq: Once | RESPIRATORY_TRACT | Status: DC

## 2017-07-09 NOTE — Discharge Instructions (Signed)
Continue to stay well-hydrated. Gargle warm salt water and spit it out and use chloraseptic spray as needed for sore throat. Continue to alternate between Tylenol and Ibuprofen for pain or fever. Use Mucinex for cough suppression/expectoration of mucus. Use netipot and flonase to help with nasal congestion. May consider over-the-counter Benadryl or other antihistamine to decrease secretions and for help with your symptoms. Use inhaler as directed, as needed for cough/chest congestion/wheezing/shortness of breath. STOP SMOKING! Follow up with the [facility] in 5-7 days for recheck of ongoing symptoms and to establish medical care. Return to emergency department for emergent changing or worsening of symptoms.

## 2017-07-09 NOTE — ED Triage Notes (Signed)
Pt having productive cough for 4 days.  Pt states he feels like he cannot take a deep breath.  No fever, some chills.  Some sore throat.

## 2017-07-09 NOTE — ED Provider Notes (Signed)
[facility] EMERGENCY DEPARTMENT Provider Note   CSN: [id number] Arrival date & time: 07/09/17  4696     History   Chief Complaint Chief Complaint  Patient presents with  . Cough    HPI Terrence Shaw is a 27 y.o. male with a PMHx of GERD, who presents to the ED with complaints of cough and URI symptoms for the last 4 days.  Symptoms include cough with green sputum production, shortness of breath, rhinorrhea, body aches, chest tightness, wheezing, sore throat, and mild nausea.  He has not tried anything for symptoms.  He states that his symptoms worsen when he walks or smokes cigarettes.  He endorses being a cigarette smoker but plans to quit.  He denies any history of asthma or COPD.  Positive sick contacts recently.  He denies any fevers, chills, ear pain or drainage, drooling, trismus, CP, abdominal pain, vomiting, diarrhea, constipation, dysuria, hematuria, numbness, tingling, focal weakness, or any other complaints at this time.  The history is provided by the patient and medical records. No language interpreter was used.    Past Medical History:  Diagnosis Date  . Depression   . GERD (gastroesophageal reflux disease)     Patient Active Problem List   Diagnosis Date Noted  . Open body of mandible fracture (HCC) 05/24/2015    Past Surgical History:  Procedure Laterality Date  . MANDIBULAR HARDWARE REMOVAL N/A 07/19/2015   Procedure: REMOVAL OF MANDIBULOMAXILLARY FIXATION Herma Mering;  Surgeon: Osborn Coho, MD;  Location:  SURGERY CENTER;  Service: ENT;  Laterality: N/A;  . ORIF MANDIBULAR FRACTURE Left 05/24/2015   Procedure: OPEN REDUCTION INTERNAL FIXATION (ORIF) MANDIBULAR FRACTURE;  Surgeon: Osborn Coho, MD;  Location: [facility] OR;  Service: ENT;  Laterality: Left;  . WISDOM TOOTH EXTRACTION          Home Medications    Prior to Admission medications   Not on File    Family History History reviewed. No pertinent family history.  Social  History Social History   Tobacco Use  . Smoking status: Current Every Day Smoker    Packs/day: 1.00    Types: Cigarettes  . Smokeless tobacco: Never Used  Substance Use Topics  . Alcohol use: No  . Drug use: No     Allergies   Patient has no known allergies.   Review of Systems Review of Systems  Constitutional: Negative for chills and fever.  HENT: Positive for rhinorrhea and sore throat. Negative for drooling, ear discharge, ear pain and trouble swallowing.   Respiratory: Positive for cough, chest tightness, shortness of breath and wheezing.   Cardiovascular: Negative for chest pain.  Gastrointestinal: Positive for nausea. Negative for abdominal pain, constipation, diarrhea and vomiting.  Genitourinary: Negative for dysuria and hematuria.  Musculoskeletal: Positive for myalgias. Negative for arthralgias.  Skin: Negative for color change.  Allergic/Immunologic: Negative for immunocompromised state.  Neurological: Negative for weakness and numbness.  Psychiatric/Behavioral: Negative for confusion.   All other systems reviewed and are negative for acute change except as noted in the HPI.    Physical Exam Updated Vital Signs BP (!) 154/85 (BP Location: Right Arm)   Pulse 77   Temp 98.6 F (37 C) (Oral)   Resp 20   Ht 5\' 11"  (1.803 m)   Wt 83.9 kg (185 lb)   SpO2 98%   BMI 25.80 kg/m   Physical Exam  Constitutional: He is oriented to person, place, and time. Vital signs are normal. He appears well-developed and well-nourished.  Non-toxic  appearance. No distress.  Afebrile, nontoxic, NAD  HENT:  Head: Normocephalic and atraumatic.  Nose: Mucosal edema present.  Mouth/Throat: Uvula is midline and mucous membranes are normal. No trismus in the jaw. No uvula swelling. Posterior oropharyngeal erythema present. No oropharyngeal exudate, posterior oropharyngeal edema or tonsillar abscesses. Tonsils are 0 on the right. Tonsils are 0 on the left. No tonsillar exudate.  Nose  congested. Oropharynx mildly injected, without uvular swelling or deviation, no trismus or drooling, no tonsillar swelling or exudates.  No PTA.   Eyes: Conjunctivae and EOM are normal. Right eye exhibits no discharge. Left eye exhibits no discharge.  Neck: Normal range of motion. Neck supple.  Cardiovascular: Normal rate, regular rhythm, normal heart sounds and intact distal pulses. Exam reveals no gallop and no friction rub.  No murmur heard. Pulmonary/Chest: Effort normal. No respiratory distress. He has no decreased breath sounds. He has wheezes. He has rhonchi. He has no rales.  Diffuse course rhonchi throughout with faint expiratory wheezing throughout, no rales, no hypoxia or increased WOB, speaking in full sentences, SpO2 98% on RA   Abdominal: Soft. Normal appearance and bowel sounds are normal. He exhibits no distension. There is no tenderness. There is no rigidity, no rebound, no guarding, no CVA tenderness, no tenderness at McBurney's point and negative Murphy's sign.  Musculoskeletal: Normal range of motion.  Neurological: He is alert and oriented to person, place, and time. He has normal strength. No sensory deficit.  Skin: Skin is warm, dry and intact. No rash noted.  Psychiatric: He has a normal mood and affect.  Nursing note and vitals reviewed.    ED Treatments / Results  Labs (all labs ordered are listed, but only abnormal results are displayed) Labs Reviewed - No data to display  EKG None  Radiology Dg Chest 2 View  Result Date: 07/09/2017 CLINICAL DATA:  Three day history of productive cough and shortness of breath. Current smoker. EXAM: CHEST - 2 VIEW COMPARISON:  Chest x-ray of April 29, 2015 FINDINGS: The lungs are well-expanded. There is no focal infiltrate. There is no pleural effusion. The heart and pulmonary vascularity are normal. The mediastinum is normal in width. The bony thorax is unremarkable. IMPRESSION: There is no pneumonia nor other acute  cardiopulmonary abnormality. Electronically Signed   By: David  SwazilandJordan M.D.   On: 07/09/2017 10:13    Procedures Procedures (including critical care time)  Medications Ordered in ED Medications  ipratropium-albuterol (DUONEB) 0.5-2.5 (3) MG/3ML nebulizer solution 3 mL (3 mLs Nebulization Given 07/09/17 0951)  albuterol (PROVENTIL) (2.5 MG/3ML) 0.083% nebulizer solution 2.5 mg (2.5 mg Nebulization Given 07/09/17 0951)  dexamethasone (DECADRON) injection 10 mg (10 mg Intramuscular Given 07/09/17 1027)  albuterol (PROVENTIL) (2.5 MG/3ML) 0.083% nebulizer solution 2.5 mg (2.5 mg Nebulization Given 07/09/17 1037)  ipratropium-albuterol (DUONEB) 0.5-2.5 (3) MG/3ML nebulizer solution 3 mL (3 mLs Nebulization Given 07/09/17 1037)  albuterol (PROVENTIL) (2.5 MG/3ML) 0.083% nebulizer solution 2.5 mg (2.5 mg Nebulization Given 07/09/17 1200)  ipratropium-albuterol (DUONEB) 0.5-2.5 (3) MG/3ML nebulizer solution 3 mL (3 mLs Nebulization Given 07/09/17 1200)  albuterol (PROVENTIL HFA;VENTOLIN HFA) 108 (90 Base) MCG/ACT inhaler 2 puff (2 puffs Inhalation Given 07/09/17 1322)     Initial Impression / Assessment and Plan / ED Course  I have reviewed the triage vital signs and the nursing notes.  Pertinent labs & imaging results that were available during my care of the patient were reviewed by me and considered in my medical decision making (see chart for details).  27 y.o. male here with cough and URI symptoms x4 days. +Smoker. On exam, diffuse course rhonchi throughout and some faint expiratory wheezing, no rales, no hypoxia or increased WOB. CXR negative for PNA/acute findings. Pt without insurance and wants to avoid having rx's going home due to affordability. Will give decadron to avoid having to send home with prednisone, and repeat duoneb then reassess. Smoking cessation strongly encouraged.   11:40 AM Lung sounds somewhat improved after second neb tx, still some very faint expiratory wheezing and minimal  rhonchi. Will attempt one more neb tx and hopefully this will resolve the rest of the wheeze/rhonchi. Will reassess after.   1:25 PM Lung sounds improved, no ongoing wheezing/rhonchi. Will send home with inhaler, advised OTC remedies for symptomatic relief, smoking cessation again strongly encouraged, advised f/up with [facility] in 1wk for recheck and to establish medical care. I explained the diagnosis and have given explicit precautions to return to the ER including for any other new or worsening symptoms. The patient understands and accepts the medical plan as it's been dictated and I have answered their questions. Discharge instructions concerning home care and prescriptions have been given. The patient is STABLE and is discharged to home in good condition.    Final Clinical Impressions(s) / ED Diagnoses   Final diagnoses:  Cough  Upper respiratory tract infection, unspecified type  Tobacco user    ED Discharge Orders    [address] Mumby, [place], [place] 07/09/17 1326    Tilden Fossa, MD 07/10/17 [phone]

## 2018-02-10 IMAGING — CT CT ABD-PELV W/ CM
2 of 4 series · 10 of 46 positions shown, 11 images · IV contrast (Iodine)
Comparison: None.

CLINICAL DATA: Epigastric, right upper quadrant, and left lower
quadrant pain.

EXAM:
CT ABDOMEN AND PELVIS WITH CONTRAST
TECHNIQUE: Multidetector CT imaging of the abdomen and pelvis was performed
using the standard protocol following bolus administration of
intravenous contrast.
CONTRAST:  100mL OMNIPAQUE IOHEXOL 300 MG/ML  SOLN

[Series 201: routine, idose (2) · axial · 0.81mm/px · z∈[-881,-446]mm · 7 of 105 slices shown, 8 images]
[im 9/105  soft-tissue]
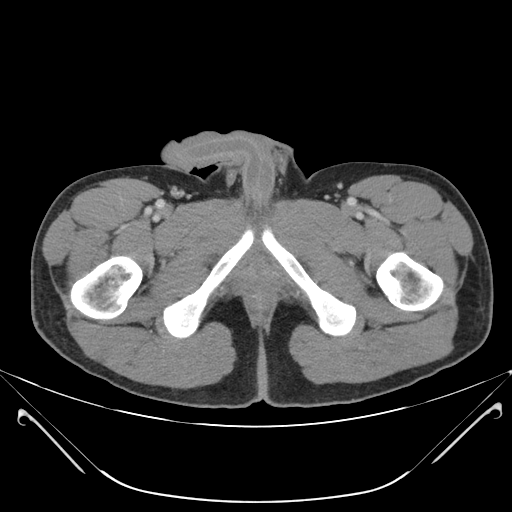
[im 9/105  bone]
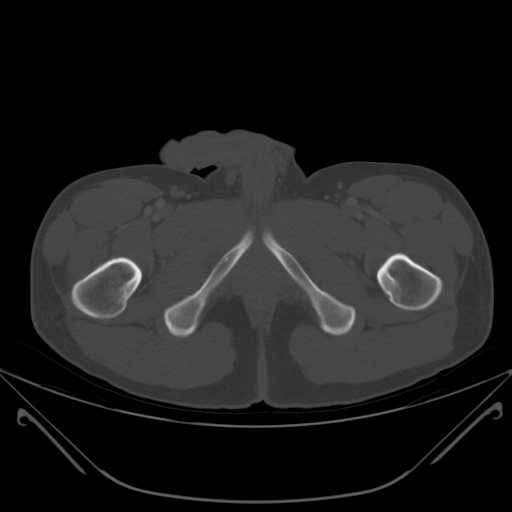
[im 22/105  soft-tissue]
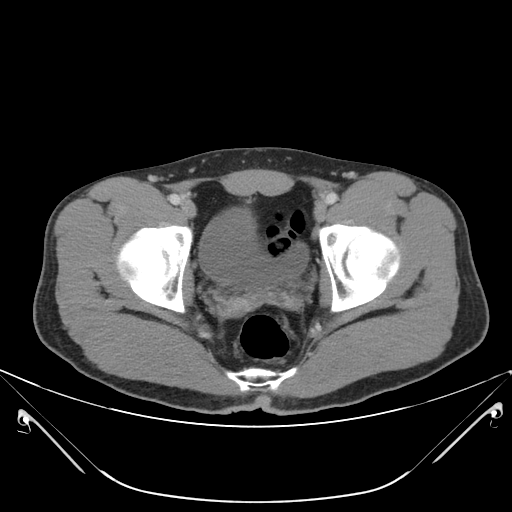
[im 40/105  soft-tissue]
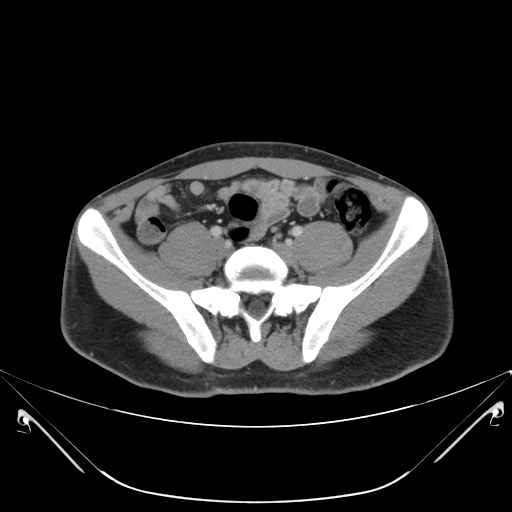
[im 53/105  soft-tissue]
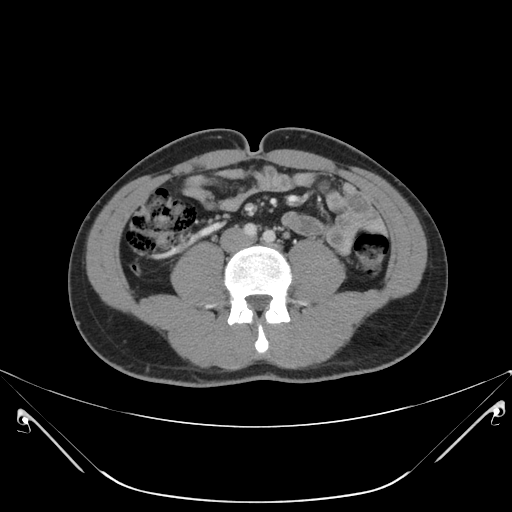
[im 66/105  soft-tissue]
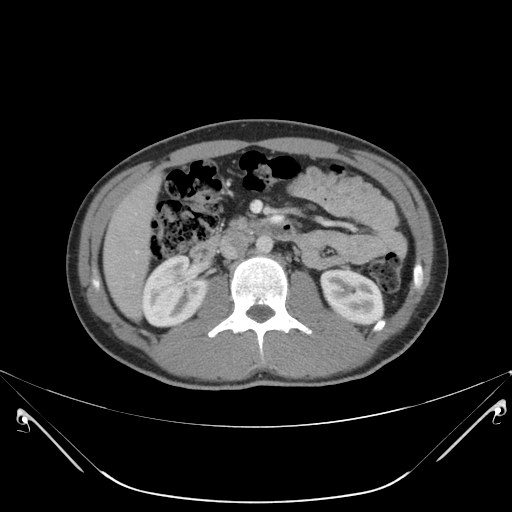
[im 83/105  soft-tissue]
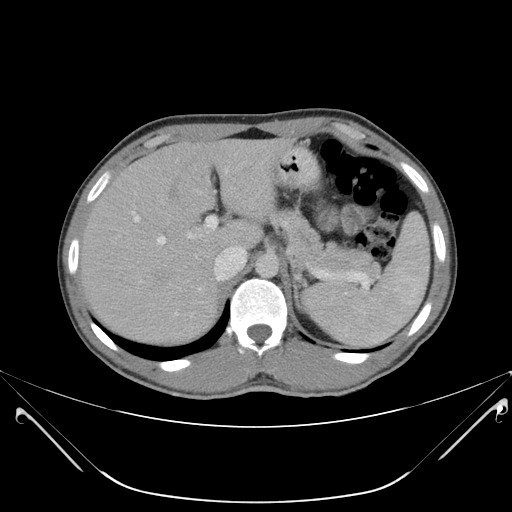
[im 96/105  soft-tissue]
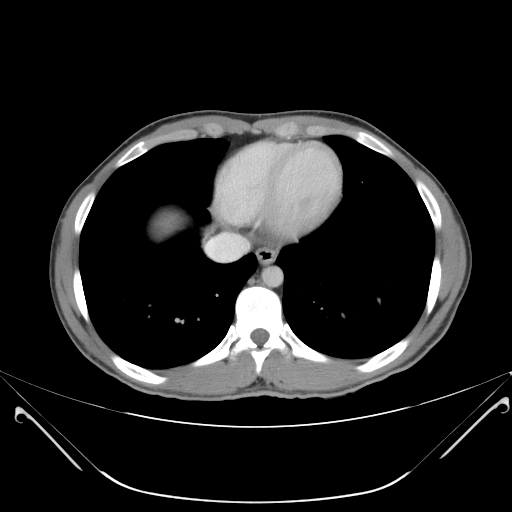

[Series 203: coronals, idose (2) · coronal · 0.45mm/px · 3 of 112 slices shown]
[im 38/112  soft-tissue]
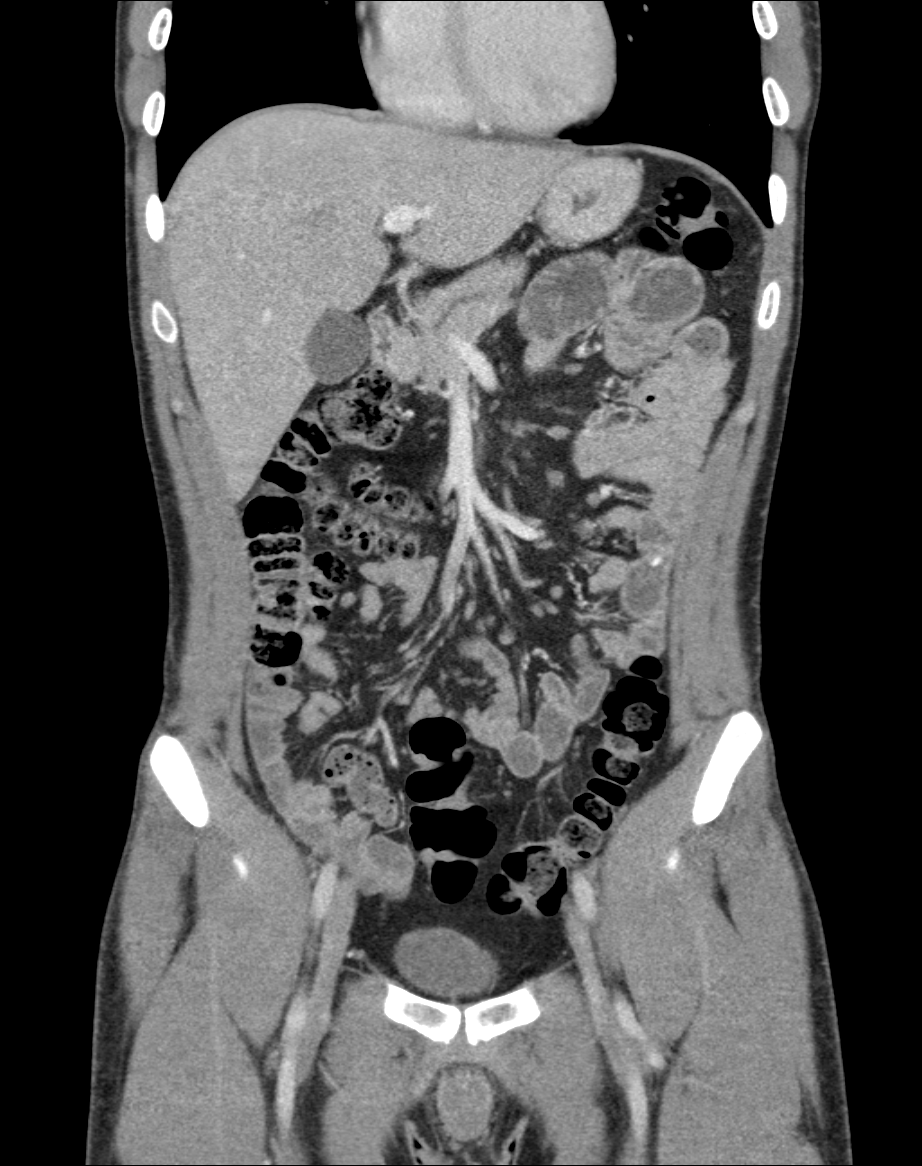
[im 50/112  soft-tissue]
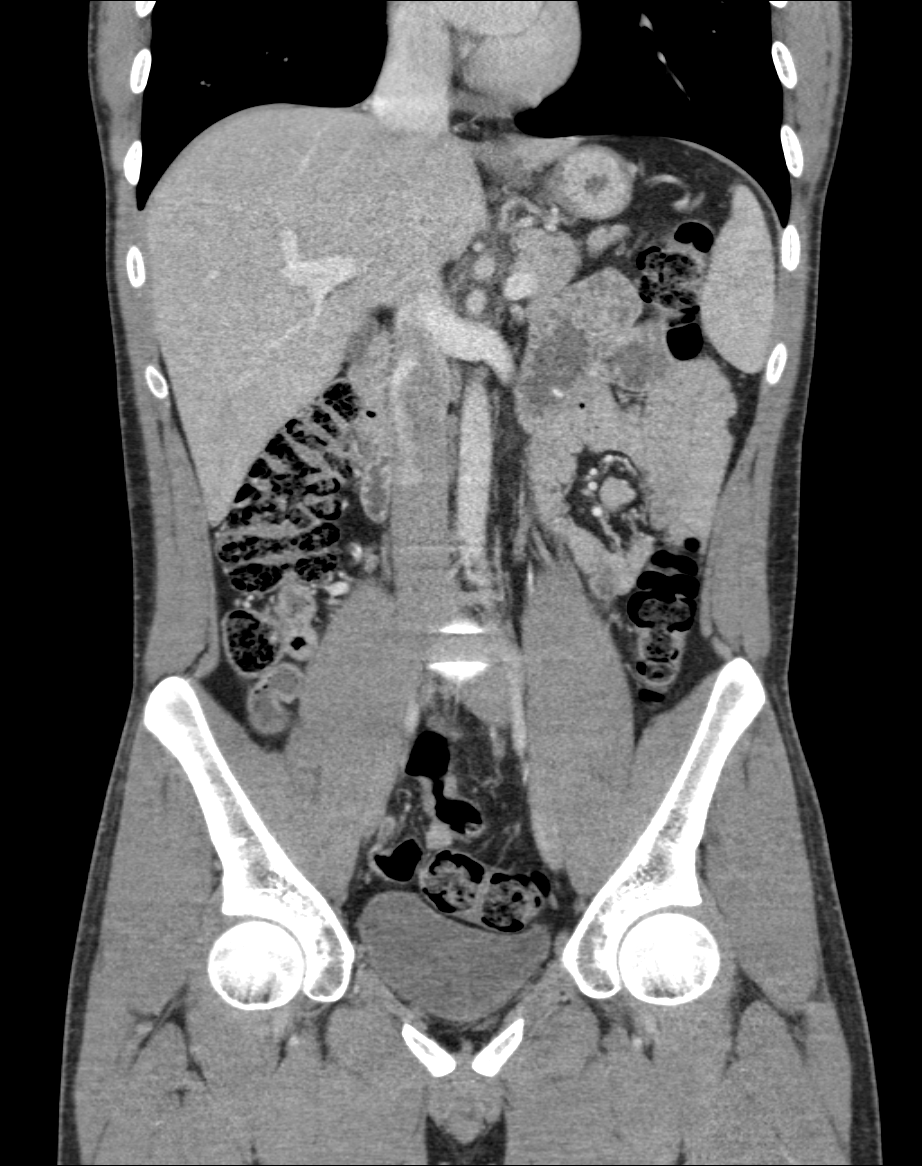
[im 62/112  soft-tissue]
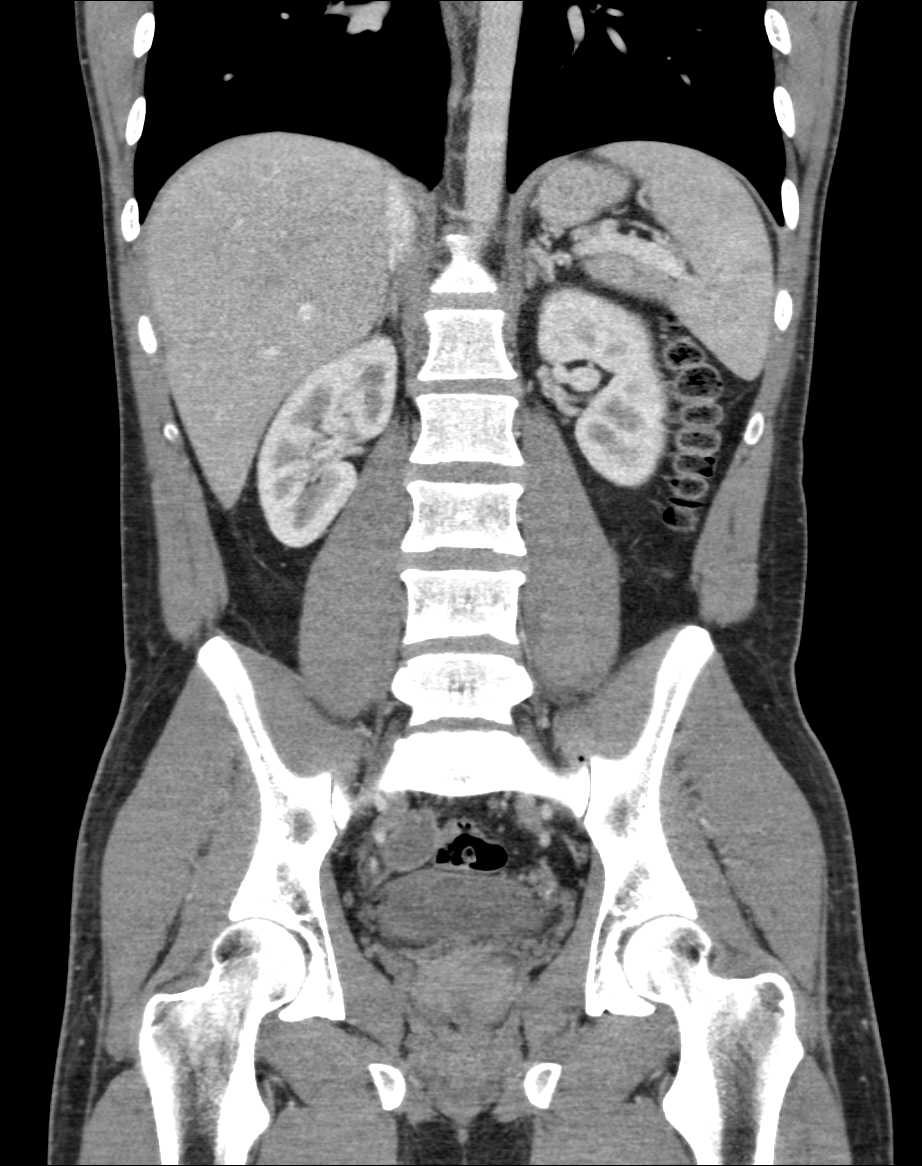

[10 of 46 positions shown; findings below may reference images not displayed]

FINDINGS: Lower chest and abdominal wall:  No contributory findings.

Hepatobiliary: No focal liver abnormality.No evidence of biliary
obstruction or stone.

Pancreas: Unremarkable.

Spleen: Unremarkable.

Adrenals/Urinary Tract: Negative adrenals. No hydronephrosis or
stone. Unremarkable bladder.

Reproductive:No pathologic findings.

Stomach/Bowel:  No obstruction. No appendicitis.

Vascular/Lymphatic: No acute vascular abnormality. No mass or
adenopathy.

Peritoneal: No ascites or pneumoperitoneum.

Musculoskeletal: No acute abnormalities.
IMPRESSION: Negative.  No explanation for abdominal pain.
